# Patient Record
Sex: Female | Born: 1990
Health system: Southern US, Community
[De-identification: ages and names within clinical notes are randomized; demographics above are authoritative.]

## PROBLEM LIST (undated history)

## (undated) DIAGNOSIS — F1010 Alcohol abuse, uncomplicated: Secondary | ICD-10-CM

## (undated) DIAGNOSIS — O039 Complete or unspecified spontaneous abortion without complication: Secondary | ICD-10-CM

## (undated) DIAGNOSIS — F1990 Other psychoactive substance use, unspecified, uncomplicated: Secondary | ICD-10-CM

## (undated) DIAGNOSIS — I10 Essential (primary) hypertension: Secondary | ICD-10-CM

## (undated) DIAGNOSIS — F319 Bipolar disorder, unspecified: Secondary | ICD-10-CM

---
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
## 2009-05-12 ENCOUNTER — Emergency Department: Payer: Self-pay | Admitting: Emergency Medicine

## 2009-06-15 ENCOUNTER — Observation Stay: Payer: Self-pay

## 2009-07-28 ENCOUNTER — Emergency Department (HOSPITAL_COMMUNITY): Admission: EM | Admit: 2009-07-28 | Discharge: 2009-07-28 | Payer: Self-pay | Admitting: Emergency Medicine

## 2009-08-28 ENCOUNTER — Observation Stay: Payer: Self-pay | Admitting: Obstetrics and Gynecology

## 2009-09-19 ENCOUNTER — Observation Stay: Payer: Self-pay

## 2009-09-26 ENCOUNTER — Inpatient Hospital Stay: Payer: Self-pay

## 2009-11-16 ENCOUNTER — Observation Stay: Payer: Self-pay | Admitting: Obstetrics & Gynecology

## 2009-12-10 ENCOUNTER — Observation Stay: Payer: Self-pay | Admitting: Obstetrics and Gynecology

## 2009-12-11 ENCOUNTER — Inpatient Hospital Stay: Payer: Self-pay | Admitting: Obstetrics and Gynecology

## 2010-06-14 ENCOUNTER — Ambulatory Visit: Payer: Self-pay | Admitting: Family Medicine

## 2010-07-11 LAB — BASIC METABOLIC PANEL
BUN: 4 mg/dL — ABNORMAL LOW (ref 6–23)
CO2: 25 mEq/L (ref 19–32)
Calcium: 8.9 mg/dL (ref 8.4–10.5)
Chloride: 105 mEq/L (ref 96–112)
Creatinine, Ser: 0.57 mg/dL (ref 0.4–1.2)
Glucose, Bld: 86 mg/dL (ref 70–99)
Sodium: 135 mEq/L (ref 135–145)

## 2010-07-11 LAB — URINALYSIS, ROUTINE W REFLEX MICROSCOPIC
Bilirubin Urine: NEGATIVE
Ketones, ur: NEGATIVE mg/dL
Nitrite: NEGATIVE
Protein, ur: NEGATIVE mg/dL
Specific Gravity, Urine: 1.005 (ref 1.005–1.030)
pH: 6 (ref 5.0–8.0)

## 2010-07-11 LAB — ABO/RH: ABO/RH(D): A POS

## 2010-07-11 LAB — DIFFERENTIAL
Basophils Relative: 0 % (ref 0–1)
Lymphocytes Relative: 23 % (ref 12–46)
Monocytes Absolute: 0.5 10*3/uL (ref 0.1–1.0)
Neutro Abs: 6.1 10*3/uL (ref 1.7–7.7)

## 2010-07-11 LAB — CBC
Hemoglobin: 12 g/dL (ref 12.0–15.0)
MCHC: 34.7 g/dL (ref 30.0–36.0)
MCV: 79.5 fL (ref 78.0–100.0)
RDW: 14.4 % (ref 11.5–15.5)
WBC: 8.8 10*3/uL (ref 4.0–10.5)

## 2010-07-11 LAB — WET PREP, GENITAL

## 2011-01-08 ENCOUNTER — Ambulatory Visit: Payer: Self-pay

## 2011-11-22 ENCOUNTER — Emergency Department: Payer: Self-pay | Admitting: Emergency Medicine

## 2011-11-22 LAB — COMPREHENSIVE METABOLIC PANEL
Albumin: 4.2 g/dL (ref 3.4–5.0)
Anion Gap: 15 (ref 7–16)
Calcium, Total: 9.2 mg/dL (ref 8.5–10.1)
EGFR (African American): >60
EGFR (Non-African Amer.): >60
Glucose: 90 mg/dL (ref 65–99)
Potassium: 3.8 mmol/L (ref 3.5–5.1)
SGOT(AST): 49 U/L — ABNORMAL HIGH (ref 15–37)

## 2011-11-22 LAB — URINALYSIS, COMPLETE
Hyaline Cast: 3
Nitrite: NEGATIVE
Ph: 5 (ref 4.5–8.0)
Protein: 100
RBC,UR: 2 /HPF (ref 0–5)
Specific Gravity: 1.009 (ref 1.003–1.030)
WBC UR: 10 /HPF (ref 0–5)

## 2011-11-22 LAB — DRUG SCREEN, URINE
Amphetamines, Ur Screen: NEGATIVE (ref ?–1000)
Cocaine Metabolite,Ur ~~LOC~~: NEGATIVE (ref ?–300)
MDMA (Ecstasy)Ur Screen: NEGATIVE (ref ?–500)
Methadone, Ur Screen: NEGATIVE (ref ?–300)
Opiate, Ur Screen: NEGATIVE (ref ?–300)
Tricyclic, Ur Screen: NEGATIVE (ref ?–1000)

## 2011-11-22 LAB — CBC WITH DIFFERENTIAL/PLATELET
Basophil #: 0.1 10*3/uL (ref 0.0–0.1)
HGB: 14.1 g/dL (ref 12.0–16.0)
Lymphocyte #: 1.3 10*3/uL (ref 1.0–3.6)
Lymphocyte %: 6.6 %
Monocyte %: 4.6 %
Platelet: 262 10*3/uL (ref 150–440)
RBC: 5.34 10*6/uL — ABNORMAL HIGH (ref 3.80–5.20)
WBC: 19.9 10*3/uL — ABNORMAL HIGH (ref 3.6–11.0)

## 2011-11-22 LAB — ETHANOL: Ethanol: 171 mg/dL

## 2012-04-13 ENCOUNTER — Encounter (HOSPITAL_COMMUNITY): Payer: Self-pay | Admitting: *Deleted

## 2012-04-13 ENCOUNTER — Emergency Department (HOSPITAL_COMMUNITY)
Admission: EM | Admit: 2012-04-13 | Discharge: 2012-04-13 | Payer: Medicaid Other | Attending: Emergency Medicine | Admitting: Emergency Medicine

## 2012-04-13 ENCOUNTER — Emergency Department (HOSPITAL_COMMUNITY): Payer: Medicaid Other

## 2012-04-13 DIAGNOSIS — F1010 Alcohol abuse, uncomplicated: Secondary | ICD-10-CM | POA: Insufficient documentation

## 2012-04-13 DIAGNOSIS — S0003XA Contusion of scalp, initial encounter: Secondary | ICD-10-CM | POA: Insufficient documentation

## 2012-04-13 DIAGNOSIS — S46909A Unspecified injury of unspecified muscle, fascia and tendon at shoulder and upper arm level, unspecified arm, initial encounter: Secondary | ICD-10-CM | POA: Insufficient documentation

## 2012-04-13 DIAGNOSIS — S60211A Contusion of right wrist, initial encounter: Secondary | ICD-10-CM

## 2012-04-13 DIAGNOSIS — F17200 Nicotine dependence, unspecified, uncomplicated: Secondary | ICD-10-CM | POA: Insufficient documentation

## 2012-04-13 DIAGNOSIS — S0083XA Contusion of other part of head, initial encounter: Secondary | ICD-10-CM

## 2012-04-13 DIAGNOSIS — M542 Cervicalgia: Secondary | ICD-10-CM

## 2012-04-13 DIAGNOSIS — S4980XA Other specified injuries of shoulder and upper arm, unspecified arm, initial encounter: Secondary | ICD-10-CM | POA: Insufficient documentation

## 2012-04-13 DIAGNOSIS — S60219A Contusion of unspecified wrist, initial encounter: Secondary | ICD-10-CM | POA: Insufficient documentation

## 2012-04-13 DIAGNOSIS — IMO0002 Reserved for concepts with insufficient information to code with codable children: Secondary | ICD-10-CM | POA: Insufficient documentation

## 2012-04-13 MED ORDER — OXYCODONE-ACETAMINOPHEN 5-325 MG PO TABS
1.0000 | ORAL_TABLET | Freq: Once | ORAL | Status: AC
  Administered 2012-04-13: 1 via ORAL
  Filled 2012-04-13: qty 1

## 2012-04-13 MED ORDER — HYDROCODONE-ACETAMINOPHEN 5-325 MG PO TABS: 1.0000 | ORAL_TABLET | Freq: Four times a day (QID) | ORAL | Status: DC | PRN

## 2012-04-13 MED ORDER — IBUPROFEN 600 MG PO TABS: 600.0000 mg | ORAL_TABLET | Freq: Three times a day (TID) | ORAL | Status: DC | PRN
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
## 2012-09-05 IMAGING — CR CERVICAL SPINE - COMPLETE 4+ VIEW
1 series · 8 of 8 positions shown · non-contrast
Comparison: none

REASON FOR EXAM: mcv
COMMENTS:   LMP: (Male)

[Series 1: view not recorded · 0.17mm/px · 8 of 8 slices shown]
[im 1/8]
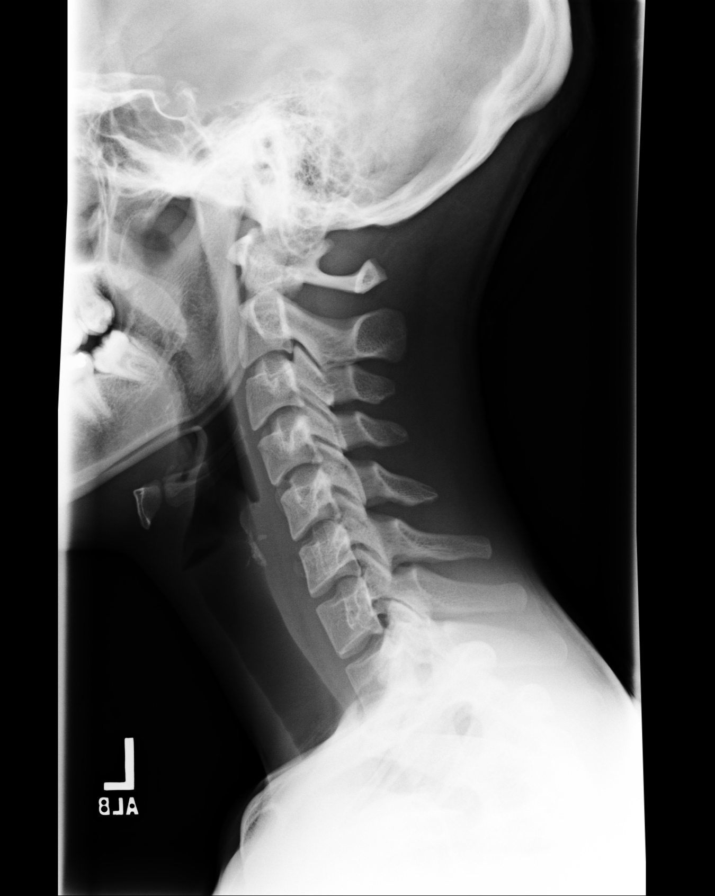
[im 2/8]
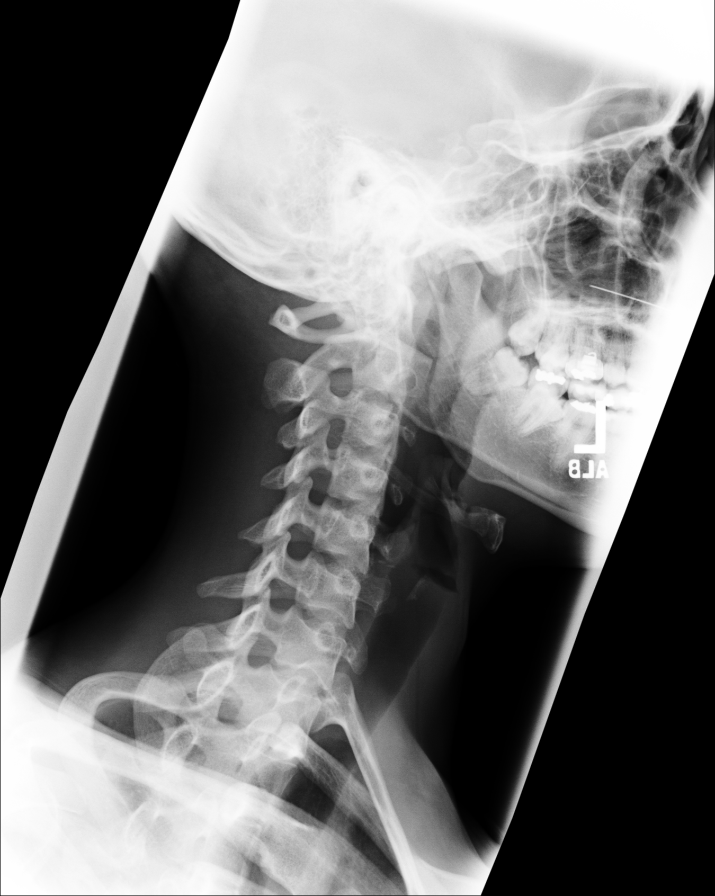
[im 3/8]
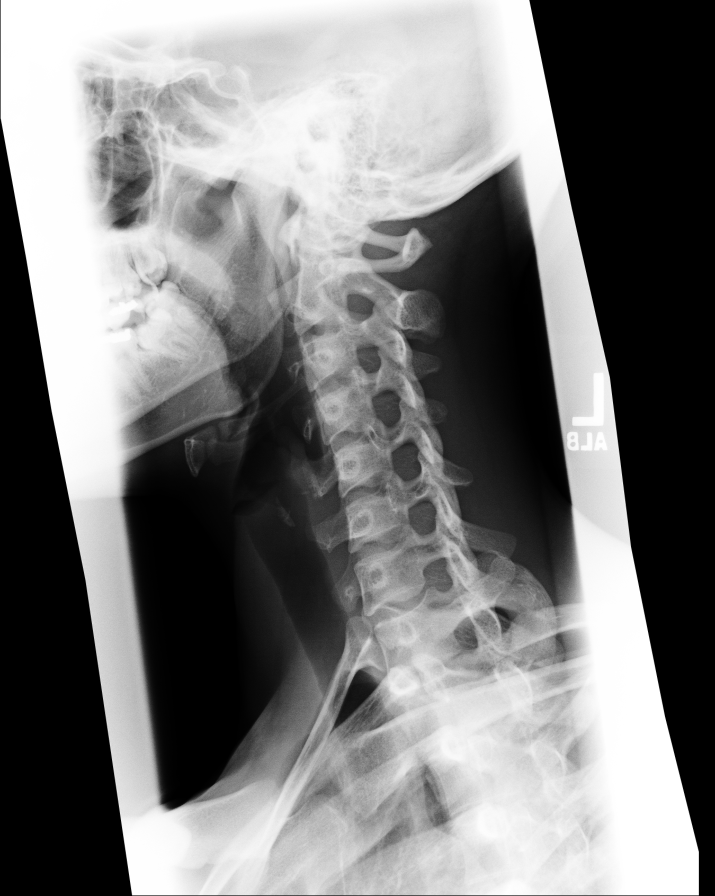
[im 4/8]
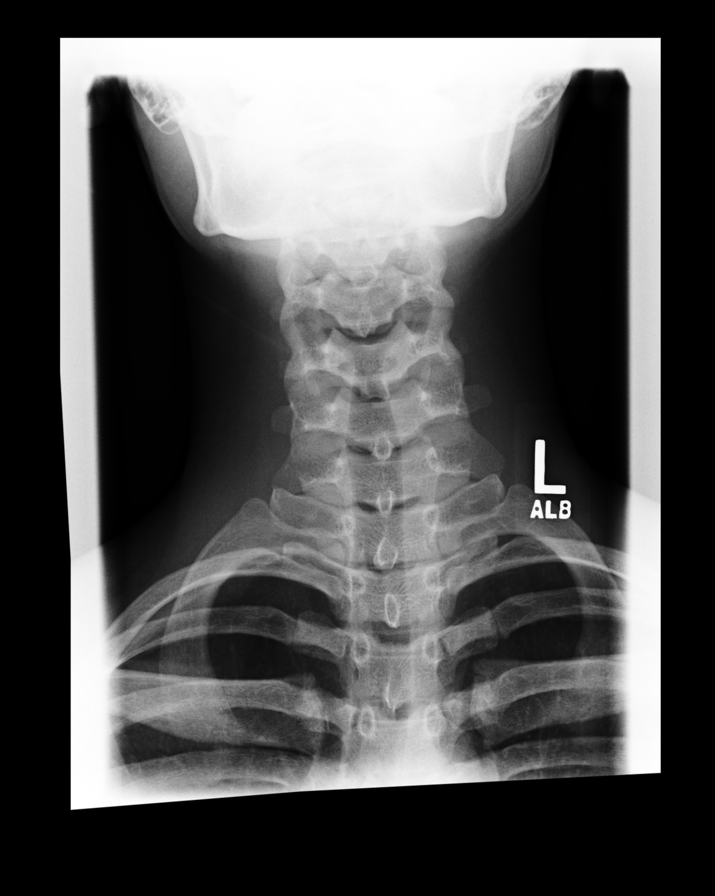
[im 5/8]
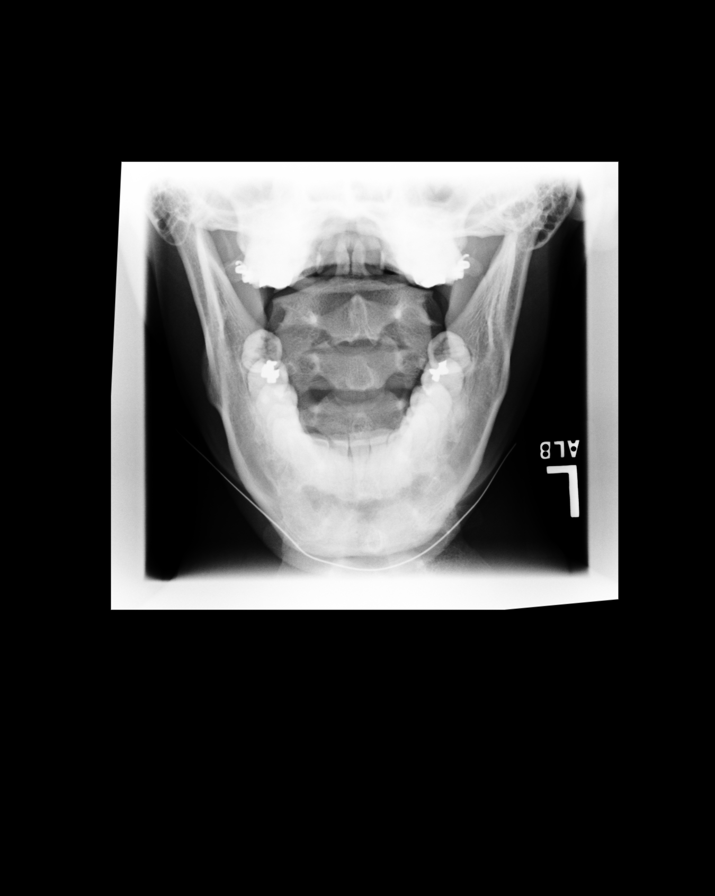
[im 6/8]
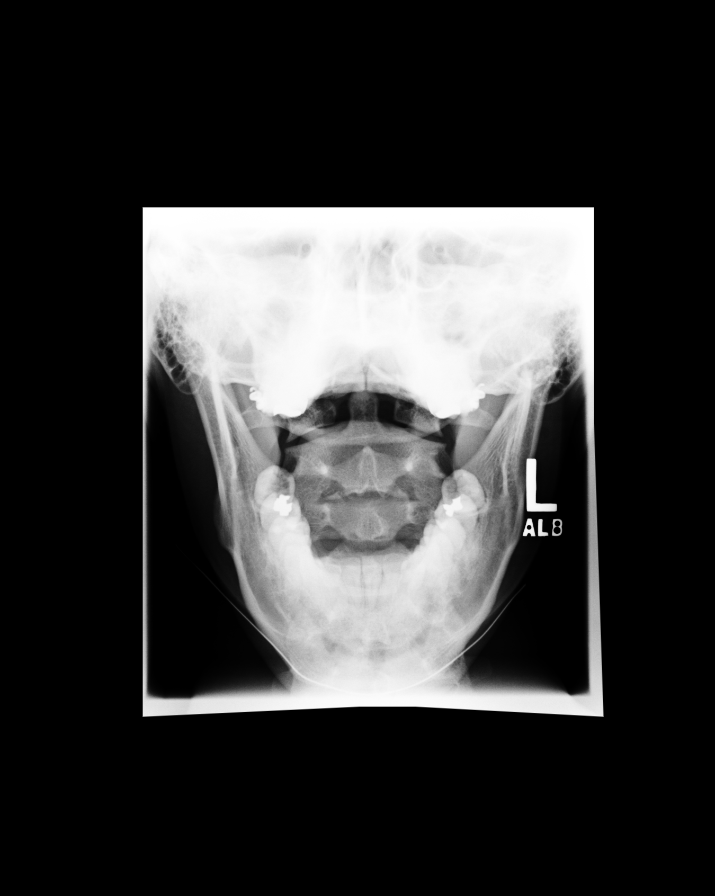
[im 7/8]
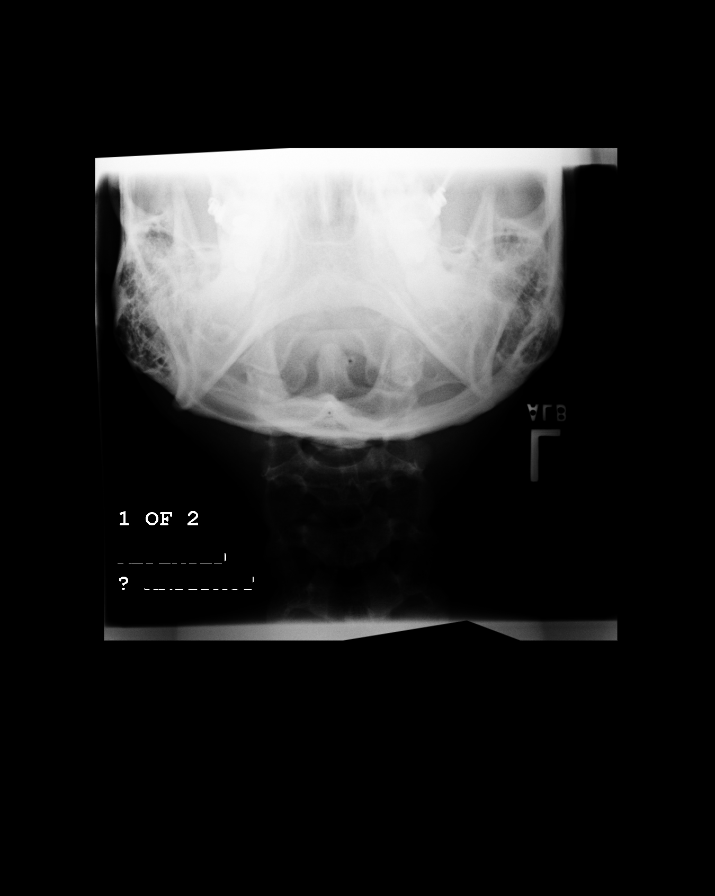
[im 8/8]
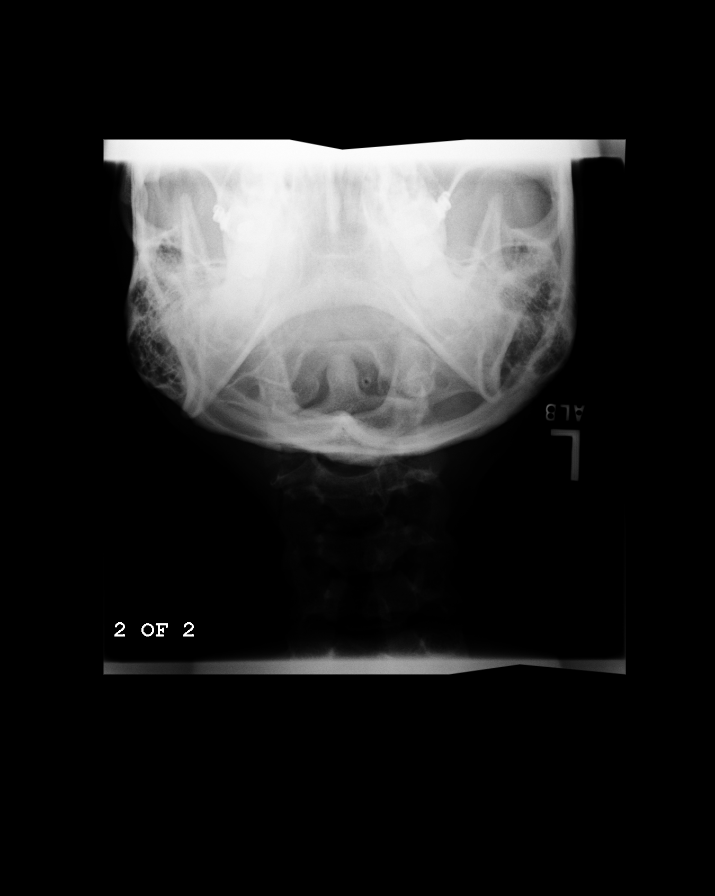

[8 of 8 positions shown; findings below may reference images not displayed]

PROCEDURE:     MDR - MDR CERVICAL SPINE COMPLETE  - January 08, 2011  [DATE]

RESULT:     The cervical vertebral bodies are preserved in height. There is
mild loss of the normal cervical lordosis. The intervertebral disc space
heights are well-maintained. The prevertebral soft tissue spaces appear
normal. The spinous processes are intact. There is no evidence of a perched
facet. The odontoid is intact and the lateral masses of C1 align normally
with those of C2.
IMPRESSION: I see no acute bony abnormality of the cervical spine.

## 2013-03-17 ENCOUNTER — Emergency Department: Payer: Self-pay | Admitting: Emergency Medicine

## 2013-03-17 LAB — DRUG SCREEN, URINE
Amphetamines, Ur Screen: NEGATIVE (ref ?–1000)
Barbiturates, Ur Screen: NEGATIVE (ref ?–200)
Cannabinoid 50 Ng, Ur ~~LOC~~: NEGATIVE (ref ?–50)
Cocaine Metabolite,Ur ~~LOC~~: NEGATIVE (ref ?–300)
MDMA (Ecstasy)Ur Screen: NEGATIVE (ref ?–500)

## 2013-03-17 LAB — CBC
HCT: 42.4 % (ref 35.0–47.0)
HGB: 13.9 g/dL (ref 12.0–16.0)
MCH: 26.3 pg (ref 26.0–34.0)
MCV: 80 fL (ref 80–100)
Platelet: 276 10*3/uL (ref 150–440)
RBC: 5.29 10*6/uL — ABNORMAL HIGH (ref 3.80–5.20)
RDW: 14.7 % — ABNORMAL HIGH (ref 11.5–14.5)
WBC: 6.3 10*3/uL (ref 3.6–11.0)

## 2013-03-17 LAB — URINALYSIS, COMPLETE
Bilirubin,UR: NEGATIVE
Blood: NEGATIVE
Leukocyte Esterase: NEGATIVE
Squamous Epithelial: 1

## 2013-03-17 LAB — PREGNANCY, URINE: Pregnancy Test, Urine: NEGATIVE m[IU]/mL

## 2013-03-17 LAB — COMPREHENSIVE METABOLIC PANEL
Albumin: 4.4 g/dL (ref 3.4–5.0)
Alkaline Phosphatase: 70 U/L
Anion Gap: 7 (ref 7–16)
BUN: 6 mg/dL — ABNORMAL LOW (ref 7–18)
SGOT(AST): 21 U/L (ref 15–37)
SGPT (ALT): 21 U/L (ref 12–78)

## 2013-03-17 LAB — SALICYLATE LEVEL: Salicylates, Serum: 3.3 mg/dL — ABNORMAL HIGH

## 2014-06-05 ENCOUNTER — Emergency Department: Payer: Self-pay | Admitting: Emergency Medicine

## 2014-08-11 ENCOUNTER — Emergency Department
Admit: 2014-08-11 | Disposition: A | Discharge status: Home / Self Care | Payer: Self-pay | Admitting: Emergency Medicine
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
## 2014-08-12 NOTE — Consult Note (Signed)
Brief Consult Note: Diagnosis: alcohol abuse.   Patient was seen by consultant.   Consult note dictated.   Discussed with Attending MD.   Comments: Psychiatry: Patient seen. Chart reviewed. Discussed with ER MD. Patient denies any suicidal ideation and she is calm and not delirius. Has outpt treatment availible already. Not meeting commitmnt criteria. Counceling done. Advise release from ER.  Electronic Signatures: Audery Amellapacs, Josetta Wigal T (MD)  (Signed [phone]-Nov-14 11:14)  Authored: Brief Consult Note   Last Updated: 26-Nov-14 11:14 by Audery Amellapacs, Nivea Wojdyla T (MD)

## 2014-08-14 LAB — BETA STREP CULTURE(ARMC)

## 2015-01-25 ENCOUNTER — Emergency Department
Admission: EM | Admit: 2015-01-25 | Discharge: 2015-01-25 | Discharge status: Against Medical Advice | Payer: Medicaid Other | Attending: Emergency Medicine | Admitting: Emergency Medicine

## 2015-01-25 DIAGNOSIS — K0889 Other specified disorders of teeth and supporting structures: Secondary | ICD-10-CM | POA: Insufficient documentation

## 2015-01-25 DIAGNOSIS — Z720 Tobacco use: Secondary | ICD-10-CM | POA: Insufficient documentation

## 2015-01-25 NOTE — ED Notes (Signed)
Pt to ed with c/o dental pain in left side and states "I need my tooth to be removed today"  I advised we do not offer dental removal, however that our mds would be glad to see her for abscess or infection of tooth.  Pt states "no I had a friend who came up here and got his tooth pulled out of his mouth"  I told her that was not a service of the ER.  Then she stated " I want to be checked for and STD"  Pt denies vaginal d/c, denies odor, denies pain. I asked her about recent exposure to STD and she denies that as well.  She states she wants an HIV test.  I told her we do not offer testing of HIV but suggested the health department as a resource.  She then states I am not waiting then to be seen since you can't pull my teeth and you can't check me for HIV." and pt left.

## 2015-07-06 ENCOUNTER — Emergency Department
Admission: EM | Admit: 2015-07-06 | Discharge: 2015-07-07 | Disposition: A | Discharge status: Home / Self Care | Payer: Self-pay | Attending: Emergency Medicine | Admitting: Emergency Medicine

## 2015-07-06 ENCOUNTER — Emergency Department
Admission: EM | Admit: 2015-07-06 | Discharge: 2015-07-06 | Disposition: A | Discharge status: Home / Self Care | Payer: Medicaid Other | Attending: Emergency Medicine | Admitting: Emergency Medicine

## 2015-07-06 DIAGNOSIS — F1994 Other psychoactive substance use, unspecified with psychoactive substance-induced mood disorder: Secondary | ICD-10-CM

## 2015-07-06 DIAGNOSIS — T300 Burn of unspecified body region, unspecified degree: Secondary | ICD-10-CM

## 2015-07-06 DIAGNOSIS — Z791 Long term (current) use of non-steroidal anti-inflammatories (NSAID): Secondary | ICD-10-CM | POA: Insufficient documentation

## 2015-07-06 DIAGNOSIS — T23291A Burn of second degree of multiple sites of right wrist and hand, initial encounter: Secondary | ICD-10-CM | POA: Insufficient documentation

## 2015-07-06 DIAGNOSIS — Y939 Activity, unspecified: Secondary | ICD-10-CM | POA: Insufficient documentation

## 2015-07-06 DIAGNOSIS — Y929 Unspecified place or not applicable: Secondary | ICD-10-CM | POA: Insufficient documentation

## 2015-07-06 DIAGNOSIS — Z79899 Other long term (current) drug therapy: Secondary | ICD-10-CM | POA: Insufficient documentation

## 2015-07-06 DIAGNOSIS — T23201A Burn of second degree of right hand, unspecified site, initial encounter: Secondary | ICD-10-CM | POA: Insufficient documentation

## 2015-07-06 DIAGNOSIS — F10921 Alcohol use, unspecified with intoxication delirium: Secondary | ICD-10-CM

## 2015-07-06 DIAGNOSIS — X58XXXA Exposure to other specified factors, initial encounter: Secondary | ICD-10-CM | POA: Insufficient documentation

## 2015-07-06 DIAGNOSIS — F1410 Cocaine abuse, uncomplicated: Secondary | ICD-10-CM

## 2015-07-06 DIAGNOSIS — F17200 Nicotine dependence, unspecified, uncomplicated: Secondary | ICD-10-CM | POA: Insufficient documentation

## 2015-07-06 DIAGNOSIS — Y999 Unspecified external cause status: Secondary | ICD-10-CM | POA: Insufficient documentation

## 2015-07-06 DIAGNOSIS — F1010 Alcohol abuse, uncomplicated: Secondary | ICD-10-CM

## 2015-07-06 DIAGNOSIS — F10121 Alcohol abuse with intoxication delirium: Secondary | ICD-10-CM | POA: Insufficient documentation

## 2015-07-06 DIAGNOSIS — T23292A Burn of second degree of multiple sites of left wrist and hand, initial encounter: Secondary | ICD-10-CM | POA: Insufficient documentation

## 2015-07-06 DIAGNOSIS — T23202A Burn of second degree of left hand, unspecified site, initial encounter: Secondary | ICD-10-CM | POA: Insufficient documentation

## 2015-07-06 DIAGNOSIS — X088XXA Exposure to other specified smoke, fire and flames, initial encounter: Secondary | ICD-10-CM | POA: Insufficient documentation

## 2015-07-06 LAB — CBC WITH DIFFERENTIAL/PLATELET
Basophils Absolute: 0.1 10*3/uL (ref 0–0.1)
EOS ABS: 0.2 10*3/uL (ref 0–0.7)
Eosinophils Relative: 2 %
HCT: 43.6 % (ref 35.0–47.0)
HEMOGLOBIN: 14.4 g/dL (ref 12.0–16.0)
Lymphocytes Relative: 26 %
Lymphs Abs: 2 10*3/uL (ref 1.0–3.6)
MCH: 28.2 pg (ref 26.0–34.0)
MCHC: 33.1 g/dL (ref 32.0–36.0)
MCV: 85 fL (ref 80.0–100.0)
Monocytes Absolute: 0.4 10*3/uL (ref 0.2–0.9)
Neutro Abs: 5 10*3/uL (ref 1.4–6.5)
Platelets: 229 10*3/uL (ref 150–440)
RBC: 5.13 MIL/uL (ref 3.80–5.20)
RDW: 13.8 % (ref 11.5–14.5)
WBC: 7.7 10*3/uL (ref 3.6–11.0)

## 2015-07-06 LAB — COMPREHENSIVE METABOLIC PANEL
ALK PHOS: 61 U/L (ref 38–126)
ALT: 16 U/L (ref 14–54)
AST: 27 U/L (ref 15–41)
Albumin: 4.8 g/dL (ref 3.5–5.0)
Anion gap: 10 (ref 5–15)
BILIRUBIN TOTAL: 0.6 mg/dL (ref 0.3–1.2)
BUN: 10 mg/dL (ref 6–20)
CALCIUM: 9.4 mg/dL (ref 8.9–10.3)
CO2: 22 mmol/L (ref 22–32)
CREATININE: 1.04 mg/dL — AB (ref 0.44–1.00)
Chloride: 109 mmol/L (ref 101–111)
Glucose, Bld: 84 mg/dL (ref 65–99)
Potassium: 3.3 mmol/L — ABNORMAL LOW (ref 3.5–5.1)
Sodium: 141 mmol/L (ref 135–145)
Total Protein: 8.5 g/dL — ABNORMAL HIGH (ref 6.5–8.1)

## 2015-07-06 LAB — SALICYLATE LEVEL

## 2015-07-06 LAB — HCG, QUANTITATIVE, PREGNANCY: hCG, Beta Chain, Quant, S: <1 m[IU]/mL (ref <5)

## 2015-07-06 LAB — ETHANOL: ALCOHOL ETHYL (B): 332 mg/dL — AB (ref <5)

## 2015-07-06 LAB — ACETAMINOPHEN LEVEL: Acetaminophen (Tylenol), Serum: <10 ug/mL — ABNORMAL LOW (ref 10–30)

## 2015-07-06 MED ORDER — ZIPRASIDONE MESYLATE 20 MG IM SOLR
INTRAMUSCULAR | Status: AC
  Administered 2015-07-06: 20 mg via INTRAMUSCULAR
  Filled 2015-07-06: qty 20

## 2015-07-06 MED ORDER — DIPHENHYDRAMINE HCL 50 MG/ML IJ SOLN
50.0000 mg | Freq: Once | INTRAMUSCULAR | Status: AC
  Administered 2015-07-06: 50 mg via INTRAMUSCULAR

## 2015-07-06 MED ORDER — DIPHENHYDRAMINE HCL 50 MG/ML IJ SOLN
INTRAMUSCULAR | Status: AC
  Administered 2015-07-06: 50 mg via INTRAMUSCULAR
  Filled 2015-07-06: qty 1

## 2015-07-06 MED ORDER — LORAZEPAM 2 MG/ML IJ SOLN
INTRAMUSCULAR | Status: AC
  Filled 2015-07-06: qty 1

## 2015-07-06 MED ORDER — ZIPRASIDONE MESYLATE 20 MG IM SOLR
20.0000 mg | Freq: Once | INTRAMUSCULAR | Status: AC
  Administered 2015-07-06: 20 mg via INTRAMUSCULAR

## 2015-07-06 NOTE — ED Notes (Signed)
IVC/ Consult pending 
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
## 2015-07-06 NOTE — ED Notes (Signed)
md notified of critical ETOH 332

## 2015-07-06 NOTE — ED Notes (Signed)

## 2015-07-06 NOTE — [facility] Counselor (Signed)
TTS counselor made attempt to assess patient; however, evaluation could not be completed due to the IM pt received earlier.  Pt to be assessed when alert.

## 2015-07-07 DIAGNOSIS — F1994 Other psychoactive substance use, unspecified with psychoactive substance-induced mood disorder: Secondary | ICD-10-CM

## 2015-07-07 DIAGNOSIS — F1410 Cocaine abuse, uncomplicated: Secondary | ICD-10-CM

## 2015-07-07 DIAGNOSIS — F1010 Alcohol abuse, uncomplicated: Secondary | ICD-10-CM

## 2015-07-07 LAB — URINALYSIS COMPLETE WITH MICROSCOPIC (ARMC ONLY)
BILIRUBIN URINE: NEGATIVE
Glucose, UA: NEGATIVE mg/dL
KETONES UR: NEGATIVE mg/dL
NITRITE: POSITIVE — AB
PH: 5 (ref 5.0–8.0)
Protein, ur: NEGATIVE mg/dL
SPECIFIC GRAVITY, URINE: 1.008 (ref 1.005–1.030)

## 2015-07-07 LAB — URINE DRUG SCREEN, QUALITATIVE (ARMC ONLY)
AMPHETAMINES, UR SCREEN: NOT DETECTED
BENZODIAZEPINE, UR SCRN: NOT DETECTED
Barbiturates, Ur Screen: NOT DETECTED
CANNABINOID 50 NG, UR ~~LOC~~: POSITIVE — AB
Cocaine Metabolite,Ur ~~LOC~~: POSITIVE — AB
MDMA (ECSTASY) UR SCREEN: NOT DETECTED
Methadone Scn, Ur: NOT DETECTED
OPIATE, UR SCREEN: NOT DETECTED
PHENCYCLIDINE (PCP) UR S: NOT DETECTED
Tricyclic, Ur Screen: NOT DETECTED

## 2015-07-07 MED ORDER — IBUPROFEN 800 MG PO TABS
800.0000 mg | ORAL_TABLET | Freq: Once | ORAL | Status: AC
  Administered 2015-07-07: 800 mg via ORAL
  Filled 2015-07-07: qty 1

## 2015-07-07 MED ORDER — SILVER SULFADIAZINE 1 % EX CREA: 1.0000 "application " | TOPICAL_CREAM | Freq: Every day | CUTANEOUS | Status: DC

## 2015-07-07 NOTE — ED Notes (Signed)
Pt got in contact with grandmother who is on the way to pick patient up.  Patient being discharged now and sent to lobby to wait for ride.

## 2015-07-07 NOTE — ED Notes (Signed)
Spoke with Gavin Poundeborah from IT who stated Waynette ButteryErica Vieau chart needed to be discharged from ED in order to [facility] correct [facility].
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
## 2015-07-07 NOTE — ED Notes (Signed)
Discharging patient from system per Gavin Poundeborah with IT to be able to merge contents to correct chart.

## 2015-07-07 NOTE — ED Notes (Signed)
Patient checked in to the ED under the incorrect name; provided sister's name. Of note, patient presented to the ED with IVC papers that also had the incorrect name. This has been a known issue since around 2000, however we have been unable to contact anyone to identify the patient who is in the ED; unknown identity and DOB. Patient identified by ED staff member who happens to be related to this patient at [id number]. Correct name and DOB entered; patient registered under new account. IT called by this RN and asked to merge records from the name that patient checked in under Kayla Conner(Kayla Conner; MRN [id number]) with the new account Kayla Conner(Kayla Conner; MRN [id number]). Magistrate being contacted by ED secretaries in efforts to obtain IVC papers that reflect the patient's true identity. ED charge nurse aware of situation.

## 2015-07-07 NOTE — ED Notes (Signed)
This [facility].  Mont states at work right now and unable to pick patient up until around 1800. Patient given phone to call to find another ride at this time.

## 2015-07-07 NOTE — ED Notes (Signed)
Patient continues to rest with NAD noted. Patient has had no further behaviors since earlier. There are no anticipated needs. Will continue to monitor.

## 2015-07-07 NOTE — ED Notes (Signed)
Pt IVC pending psych consult/Officer Giroux returned with IVC papers with the correct name of the patient.

## 2015-07-07 NOTE — ED Notes (Addendum)
Patient observed sleeping at this NAD: respirations even and non-labored; NAD noted. Issues with patient account known by previous RN; communicated to this RN; we are working with registration and IT to resolve. Patient with no anticipated needs. Reassessment limited due to previous medications (Geodon and Benadryl) was was administered earlier today. Patient also intoxicated, and with the combination of these two factors, she is somnolent but arousable. Will continue to monitor.

## 2015-07-07 NOTE — ED Notes (Signed)
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
## 2015-07-07 NOTE — ED Notes (Signed)
Patient resting in room with NAD noted at this time. Patient has been calm since family member spoke with her earlier. Patient now sleeping; respirations even and non-labored. No anticipated needs at this time. Will continue to monitor.

## 2015-07-07 NOTE — ED Notes (Addendum)
Patient acting out. Patient stripping naked and refusing to wear hospital provided scrubs citing that she wanted her own clothes. Patient attempting to leave - redirected by this RN and [employer] officer. Patient upset. Previously mentioned staff member (related to patient) present in the ED - she was asked to come to bedside to speak with patient; obliged request; situation calmed.

## 2015-07-07 NOTE — ED Notes (Signed)
Patient much more calm after speaking with family member. She has been informed that she is under IVC and that she could not leave. Patient reminded that urine sample still required; sample provided at this time. Patient provided with PO fluids. No further verbalized needs. Will continue to monitor.

## 2015-07-07 NOTE — ED Notes (Signed)
Nurse called lab to get result of urine sent.  Lab stated "had not been received" and processing it now.
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
## 2015-07-07 NOTE — Discharge Instructions (Signed)
Burn Care Your skin is a natural barrier to infection. It is the largest organ of your body. Burns damage this natural protection. To help prevent infection, it is very important to follow your caregiver's instructions in the care of your burn. Burns are classified as:  First degree. There is only redness of the skin (erythema). No scarring is expected.  Second degree. There is blistering of the skin. Scarring may occur with deeper burns.  Third degree. All layers of the skin are injured, and scarring is expected. HOME CARE INSTRUCTIONS   Wash your hands well before changing your bandage.  Change your bandage as often as directed by your caregiver.  Remove the old bandage. If the bandage sticks, you may soak it off with cool, clean water.  Cleanse the burn thoroughly but gently with mild soap and water.  Pat the area dry with a clean, dry cloth.  Apply a thin layer of antibacterial cream to the burn.  Apply a clean bandage as instructed by your caregiver.  Keep the bandage as clean and dry as possible.  Elevate the affected area for the first 24 hours, then as instructed by your caregiver.  Only take over-the-counter or prescription medicines for pain, discomfort, or fever as directed by your caregiver. SEEK IMMEDIATE MEDICAL CARE IF:   You develop excessive pain.  You develop redness, tenderness, swelling, or red streaks near the burn.  The burned area develops yellowish-white fluid (pus) or a bad smell.  You have a fever. MAKE SURE YOU:   Understand these instructions.  Will watch your condition.  Will get help right away if you are not doing well or get worse.   This information is not intended to replace advice given to you by your health care provider. Make sure you discuss any questions you have with your health care provider.   Document Released: 04/08/2005 Document Revised: 07/01/2011 Document Reviewed: 08/29/2010 Elsevier Interactive Patient Education 2016  [employer].  Polysubstance Abuse When people abuse more than one drug or type of drug it is called polysubstance or polydrug abuse. For example, many smokers also drink alcohol. This is one form of polydrug abuse. Polydrug abuse also refers to the use of a drug to counteract an unpleasant effect produced by another drug. It may also be used to help with withdrawal from another drug. People who take stimulants may become agitated. Sometimes this agitation is countered with a tranquilizer. This helps protect against the unpleasant side effects. Polydrug abuse also refers to the use of different drugs at the same time.  Anytime drug use is interfering with normal living activities, it has become abuse. This includes problems with family and friends. Psychological dependence has developed when your mind tells you that the drug is needed. This is usually followed by physical dependence which has developed when continuing increases of drug are required to get the same feeling or "high". This is known as addiction or chemical dependency. A person's risk is much higher if there is a history of chemical dependency in the family. SIGNS OF CHEMICAL DEPENDENCY  You have been told by friends or family that drugs have become a problem.  You fight when using drugs.  You are having blackouts (not remembering what you do while using).  You feel sick from using drugs but continue using.  You lie about use or amounts of drugs (chemicals) used.  You need chemicals to get you going.  You are suffering in work performance or in school because  of drug use.  You get sick from use of drugs but continue to use anyway.  You need drugs to relate to people or feel comfortable in social situations.  You use drugs to forget problems. "Yes" answered to any of the above signs of chemical dependency indicates there are problems. The longer the use of drugs continues, the greater the problems will become. If there is a  family history of drug or alcohol use, it is best not to experiment with these drugs. Continual use leads to tolerance. After tolerance develops more of the drug is needed to get the same feeling. This is followed by addiction. With addiction, drugs become the most important part of life. It becomes more important to take drugs than participate in the other usual activities of life. This includes relating to friends and family. Addiction is followed by dependency. Dependency is a condition where drugs are now needed not just to get high, but to feel normal. Addiction cannot be cured but it can be stopped. This often requires outside help and the care of professionals. Treatment centers are listed in the yellow pages under: Cocaine, Narcotics, and Alcoholics Anonymous. Most hospitals and clinics can refer you to a specialized care center. Talk to your caregiver if you need help.   This information is not intended to replace advice given to you by your health care provider. Make sure you discuss any questions you have with your health care provider.   Document Released: 11/28/2004 Document Revised: 07/01/2011 Document Reviewed: 04/13/2014 Elsevier Interactive Patient Education [employer]2016 Elsevier Inc.
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
## 2015-07-07 NOTE — ED Notes (Signed)
Pt requesting to speak to nurse.  States "When can I go home? I was just drunk last night, there aint nothing wrong with me".  Pt informed under IVC and will have to see psychiatry today.  Offered to turn tv on for pt or other comfort measures, pt refused and denies at this time.

## 2015-07-07 NOTE — ED Notes (Signed)
Pt IVC/when papers were initiated, pt gave the wrong name. [employer] Officer Giroux was the petitioner/I called the magistrate after registration put the right person in the system and the magistrate stated that officer Giroux is going to come and bring the papers on Entergy CorporationLakeisha Conner. The IVC papers that were originally under DrainErica Doetsch(her sister) is to be turned in by Ciscofficer Giroux and to be destroyed by [place] magistrate.

## 2016-03-13 ENCOUNTER — Encounter: Payer: Self-pay | Admitting: Emergency Medicine

## 2016-03-13 ENCOUNTER — Emergency Department: Payer: Medicaid Other

## 2016-03-13 ENCOUNTER — Emergency Department
Admission: EM | Admit: 2016-03-13 | Discharge: 2016-03-13 | Disposition: A | Discharge status: Home / Self Care | Payer: Medicaid Other | Attending: Emergency Medicine | Admitting: Emergency Medicine

## 2016-03-13 DIAGNOSIS — Y92410 Unspecified street and highway as the place of occurrence of the external cause: Secondary | ICD-10-CM | POA: Diagnosis not present

## 2016-03-13 DIAGNOSIS — Y939 Activity, unspecified: Secondary | ICD-10-CM | POA: Insufficient documentation

## 2016-03-13 DIAGNOSIS — Y999 Unspecified external cause status: Secondary | ICD-10-CM | POA: Diagnosis not present

## 2016-03-13 DIAGNOSIS — M62838 Other muscle spasm: Secondary | ICD-10-CM

## 2016-03-13 DIAGNOSIS — Z79899 Other long term (current) drug therapy: Secondary | ICD-10-CM | POA: Insufficient documentation

## 2016-03-13 DIAGNOSIS — F17200 Nicotine dependence, unspecified, uncomplicated: Secondary | ICD-10-CM | POA: Diagnosis not present

## 2016-03-13 DIAGNOSIS — S46912A Strain of unspecified muscle, fascia and tendon at shoulder and upper arm level, left arm, initial encounter: Secondary | ICD-10-CM | POA: Diagnosis not present

## 2016-03-13 DIAGNOSIS — Z791 Long term (current) use of non-steroidal anti-inflammatories (NSAID): Secondary | ICD-10-CM | POA: Insufficient documentation

## 2016-03-13 DIAGNOSIS — S4992XA Unspecified injury of left shoulder and upper arm, initial encounter: Secondary | ICD-10-CM | POA: Diagnosis present

## 2016-03-13 HISTORY — DX: Complete or unspecified spontaneous abortion without complication: O03.9

## 2016-03-13 LAB — POCT PREGNANCY, URINE: PREG TEST UR: NEGATIVE

## 2016-03-13 MED ORDER — NAPROXEN 500 MG PO TABS: 500.0000 mg | ORAL_TABLET | Freq: Two times a day (BID) | ORAL | 0 refills | Status: DC

## 2016-03-13 MED ORDER — IBUPROFEN 600 MG PO TABS
600.0000 mg | ORAL_TABLET | Freq: Once | ORAL | Status: AC
Start: 2016-03-13 — End: 2016-03-13
  Administered 2016-03-13: 600 mg via ORAL
  Filled 2016-03-13: qty 1

## 2016-03-13 MED ORDER — CYCLOBENZAPRINE HCL 5 MG PO TABS: 5.0000 mg | ORAL_TABLET | Freq: Three times a day (TID) | ORAL | 0 refills | Status: DC | PRN
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
## 2016-03-13 NOTE — ED Notes (Signed)
Pt verbalized understanding of discharge instructions. NAD at this time. 
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
## 2016-11-05 ENCOUNTER — Encounter: Payer: Self-pay | Admitting: Emergency Medicine

## 2016-11-05 ENCOUNTER — Ambulatory Visit
Admission: EM | Admit: 2016-11-05 | Discharge: 2016-11-05 | Disposition: A | Discharge status: Home / Self Care | Payer: Medicaid Other | Attending: Family Medicine | Admitting: Family Medicine

## 2016-11-05 ENCOUNTER — Ambulatory Visit: Payer: Medicaid Other

## 2016-11-05 DIAGNOSIS — M25571 Pain in right ankle and joints of right foot: Secondary | ICD-10-CM | POA: Diagnosis present

## 2016-11-05 DIAGNOSIS — F17210 Nicotine dependence, cigarettes, uncomplicated: Secondary | ICD-10-CM | POA: Diagnosis not present

## 2016-11-05 DIAGNOSIS — F319 Bipolar disorder, unspecified: Secondary | ICD-10-CM | POA: Insufficient documentation

## 2016-11-05 DIAGNOSIS — T25011A Burn of unspecified degree of right ankle, initial encounter: Secondary | ICD-10-CM | POA: Diagnosis not present

## 2016-11-05 DIAGNOSIS — M79671 Pain in right foot: Secondary | ICD-10-CM | POA: Diagnosis not present

## 2016-11-05 DIAGNOSIS — T24001A Burn of unspecified degree of unspecified site of right lower limb, except ankle and foot, initial encounter: Secondary | ICD-10-CM

## 2016-11-05 DIAGNOSIS — I10 Essential (primary) hypertension: Secondary | ICD-10-CM | POA: Insufficient documentation

## 2016-11-05 DIAGNOSIS — X17XXXA Contact with hot engines, machinery and tools, initial encounter: Secondary | ICD-10-CM | POA: Diagnosis not present

## 2016-11-05 HISTORY — DX: Bipolar disorder, unspecified: F31.9

## 2016-11-05 HISTORY — DX: Essential (primary) hypertension: I10

## 2016-11-05 MED ORDER — SILVER SULFADIAZINE 1 % EX CREA
TOPICAL_CREAM | Freq: Once | CUTANEOUS | Status: AC
  Administered 2016-11-05: 1 via TOPICAL

## 2016-11-05 MED ORDER — MELOXICAM 7.5 MG PO TABS: 7.5000 mg | ORAL_TABLET | Freq: Every day | ORAL | 0 refills | Status: DC

## 2016-11-05 MED ORDER — SILVER SULFADIAZINE 1 % EX CREA: 1.0000 "application " | TOPICAL_CREAM | Freq: Two times a day (BID) | CUTANEOUS | 0 refills | Status: AC

## 2016-11-05 MED ORDER — AMOXICILLIN-POT CLAVULANATE 875-125 MG PO TABS: 1.0000 | ORAL_TABLET | Freq: Two times a day (BID) | ORAL | 0 refills | Status: DC
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
## 2016-11-05 NOTE — Discharge Instructions (Signed)
Take medication as prescribed. Rest. Drink plenty of fluids. Elevate area.  Follow up with your primary care physician or Burn clinic in 2 days for recheck. Call today to schedule, this is important. Return to Urgent care for new or worsening concerns.    [facility] [id number] [place], [place] [id number] Burn Clinic: Appointments [phone]

## 2016-11-14 ENCOUNTER — Encounter: Payer: Self-pay | Admitting: Emergency Medicine

## 2016-11-14 ENCOUNTER — Emergency Department
Admission: EM | Admit: 2016-11-14 | Discharge: 2016-11-15 | Disposition: A | Discharge status: Home / Self Care | Payer: Medicaid Other | Attending: Emergency Medicine | Admitting: Emergency Medicine

## 2016-11-14 DIAGNOSIS — F10121 Alcohol abuse with intoxication delirium: Secondary | ICD-10-CM | POA: Diagnosis not present

## 2016-11-14 DIAGNOSIS — F918 Other conduct disorders: Secondary | ICD-10-CM | POA: Diagnosis present

## 2016-11-14 DIAGNOSIS — Z79899 Other long term (current) drug therapy: Secondary | ICD-10-CM | POA: Insufficient documentation

## 2016-11-14 LAB — URINE DRUG SCREEN, QUALITATIVE (ARMC ONLY)
AMPHETAMINES, UR SCREEN: NOT DETECTED
BENZODIAZEPINE, UR SCRN: NOT DETECTED
Barbiturates, Ur Screen: NOT DETECTED
COCAINE METABOLITE, UR ~~LOC~~: NOT DETECTED
Cannabinoid 50 Ng, Ur ~~LOC~~: NOT DETECTED
MDMA (ECSTASY) UR SCREEN: NOT DETECTED
METHADONE SCREEN, URINE: NOT DETECTED
OPIATE, UR SCREEN: NOT DETECTED
Phencyclidine (PCP) Ur S: NOT DETECTED
Tricyclic, Ur Screen: NOT DETECTED

## 2016-11-14 LAB — COMPREHENSIVE METABOLIC PANEL
ALT: 26 U/L (ref 14–54)
AST: 43 U/L — AB (ref 15–41)
Albumin: 4.2 g/dL (ref 3.5–5.0)
Alkaline Phosphatase: 59 U/L (ref 38–126)
Anion gap: 8 (ref 5–15)
BILIRUBIN TOTAL: 0.4 mg/dL (ref 0.3–1.2)
BUN: 6 mg/dL (ref 6–20)
CO2: 24 mmol/L (ref 22–32)
CREATININE: 0.79 mg/dL (ref 0.44–1.00)
Calcium: 9.3 mg/dL (ref 8.9–10.3)
Chloride: 115 mmol/L — ABNORMAL HIGH (ref 101–111)
Glucose, Bld: 95 mg/dL (ref 65–99)
POTASSIUM: 3.8 mmol/L (ref 3.5–5.1)
Sodium: 147 mmol/L — ABNORMAL HIGH (ref 135–145)
TOTAL PROTEIN: 7.2 g/dL (ref 6.5–8.1)

## 2016-11-14 LAB — SALICYLATE LEVEL

## 2016-11-14 LAB — URINALYSIS, COMPLETE (UACMP) WITH MICROSCOPIC
Bacteria, UA: NONE SEEN
Bilirubin Urine: NEGATIVE
GLUCOSE, UA: NEGATIVE mg/dL
HGB URINE DIPSTICK: NEGATIVE
Ketones, ur: NEGATIVE mg/dL
Leukocytes, UA: NEGATIVE
NITRITE: NEGATIVE
PH: 6 (ref 5.0–8.0)
Protein, ur: NEGATIVE mg/dL
RBC / HPF: NONE SEEN RBC/hpf (ref 0–5)
SPECIFIC GRAVITY, URINE: 1.002 — AB (ref 1.005–1.030)
WBC, UA: NONE SEEN WBC/hpf (ref 0–5)

## 2016-11-14 LAB — CBC WITH DIFFERENTIAL/PLATELET
BASOS ABS: 0 10*3/uL (ref 0–0.1)
BASOS PCT: 1 %
EOS ABS: 0.1 10*3/uL (ref 0–0.7)
Eosinophils Relative: 2 %
HCT: 37.8 % (ref 35.0–47.0)
Hemoglobin: 12.6 g/dL (ref 12.0–16.0)
Lymphocytes Relative: 24 %
Lymphs Abs: 1.5 10*3/uL (ref 1.0–3.6)
MCH: 27.9 pg (ref 26.0–34.0)
MCHC: 33.4 g/dL (ref 32.0–36.0)
MCV: 83.5 fL (ref 80.0–100.0)
MONO ABS: 0.4 10*3/uL (ref 0.2–0.9)
MONOS PCT: 7 %
Neutro Abs: 4.1 10*3/uL (ref 1.4–6.5)
Neutrophils Relative %: 66 %
PLATELETS: 242 10*3/uL (ref 150–440)
RBC: 4.53 MIL/uL (ref 3.80–5.20)
RDW: 14.4 % (ref 11.5–14.5)
WBC: 6.1 10*3/uL (ref 3.6–11.0)

## 2016-11-14 LAB — ETHANOL: Alcohol, Ethyl (B): 351 mg/dL (ref <5)

## 2016-11-14 LAB — LIPASE, BLOOD: LIPASE: 42 U/L (ref 11–51)

## 2016-11-14 LAB — PREGNANCY, URINE: PREG TEST UR: NEGATIVE

## 2016-11-14 LAB — ACETAMINOPHEN LEVEL

## 2016-11-14 MED ORDER — LORAZEPAM 2 MG/ML IJ SOLN
1.0000 mg | Freq: Once | INTRAMUSCULAR | Status: AC
  Administered 2016-11-14: 1 mg via INTRAMUSCULAR

## 2016-11-14 MED ORDER — LORAZEPAM 2 MG/ML IJ SOLN
INTRAMUSCULAR | Status: AC
  Administered 2016-11-14: 1 mg via INTRAMUSCULAR
  Filled 2016-11-14: qty 1

## 2016-11-14 NOTE — ED Notes (Signed)
Pt remains awake, has been given permission by Dr Darnelle CatalanMalinda to use the phone and call someone to visit her and take her home if the opportunity arises. Pt has called approx 5 people with no one yet agreeing to come. Pt has become very distraught, but is still cooperating.

## 2016-11-14 NOTE — Discharge Instructions (Signed)
Please do not drink anywhere near as much as you did tonight. Your alcohol level was almost in the toxic range. You can consider following up with Alcoholics Anonymous. Please follow-up with your doctor. Return for any further problems.

## 2016-11-14 NOTE — ED Notes (Signed)
Pt continues to rest comfortably at this time, without incident.   
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
## 2016-11-14 NOTE — ED Notes (Signed)
Pt has fallen asleep, and is sleeping comfortably.

## 2016-11-14 NOTE — ED Notes (Signed)
SOC camera placed in room at this time. Mardelle MatteAndy, EDT, remains in room as 1:1 Recruitment consultantsafety sitter.

## 2016-11-14 NOTE — ED Notes (Signed)
SOC session completed.

## 2016-11-14 NOTE — BH Assessment (Signed)
Pt was evaluated by Specialist on call and does not meet criteria for inpatient hospitalization.  Patient to be d/c home with responsible adult.
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
## 2016-11-14 NOTE — ED Notes (Signed)
Pt finished eating a [place] sandwich, bag of chips, and 2nd ginger ale.
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
## 2016-11-14 NOTE — ED Notes (Signed)
Pt is sitting up in bed and eating crackers, drinking ginger ale.

## 2016-11-14 NOTE — ED Notes (Signed)
Mardelle MatteAndy, EDT, still in room as 1:1 Recruitment consultantsafety sitter.

## 2016-11-14 NOTE — ED Notes (Signed)
Pt continues to rest comfortably at this time, without incident.

## 2016-11-14 NOTE — ED Notes (Signed)
Pt resting comfortably at this time, without incident.

## 2016-11-15 ENCOUNTER — Encounter: Payer: Self-pay | Admitting: Emergency Medicine

## 2016-11-15 NOTE — ED Notes (Addendum)
Pt waiting in lobby for [facility] eagle, first nurse aware, pt A&O, NAD noted
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
## 2018-10-01 ENCOUNTER — Other Ambulatory Visit: Payer: Self-pay

## 2018-10-01 ENCOUNTER — Encounter: Payer: Self-pay | Admitting: *Deleted

## 2018-10-01 ENCOUNTER — Emergency Department
Admission: EM | Admit: 2018-10-01 | Discharge: 2018-10-01 | Disposition: A | Discharge status: Home / Self Care | Payer: Self-pay | Attending: Emergency Medicine | Admitting: Emergency Medicine

## 2018-10-01 DIAGNOSIS — Y939 Activity, unspecified: Secondary | ICD-10-CM | POA: Insufficient documentation

## 2018-10-01 DIAGNOSIS — Y999 Unspecified external cause status: Secondary | ICD-10-CM | POA: Insufficient documentation

## 2018-10-01 DIAGNOSIS — W2209XA Striking against other stationary object, initial encounter: Secondary | ICD-10-CM | POA: Insufficient documentation

## 2018-10-01 DIAGNOSIS — I10 Essential (primary) hypertension: Secondary | ICD-10-CM | POA: Insufficient documentation

## 2018-10-01 DIAGNOSIS — Z79899 Other long term (current) drug therapy: Secondary | ICD-10-CM | POA: Insufficient documentation

## 2018-10-01 DIAGNOSIS — Y929 Unspecified place or not applicable: Secondary | ICD-10-CM | POA: Insufficient documentation

## 2018-10-01 DIAGNOSIS — S91112A Laceration without foreign body of left great toe without damage to nail, initial encounter: Secondary | ICD-10-CM | POA: Insufficient documentation

## 2018-10-01 DIAGNOSIS — F17210 Nicotine dependence, cigarettes, uncomplicated: Secondary | ICD-10-CM | POA: Insufficient documentation

## 2018-10-01 MED ORDER — TRAMADOL HCL 50 MG PO TABS: 50.0000 mg | ORAL_TABLET | Freq: Four times a day (QID) | ORAL | 0 refills | Status: DC | PRN

## 2018-10-01 MED ORDER — OXYCODONE HCL 5 MG PO TABS
5.0000 mg | ORAL_TABLET | Freq: Once | ORAL | Status: AC
  Administered 2018-10-01: 5 mg via ORAL
  Filled 2018-10-01: qty 1

## 2018-10-01 MED ORDER — NAPROXEN 500 MG PO TABS: 500.0000 mg | ORAL_TABLET | Freq: Two times a day (BID) | ORAL | 0 refills | Status: DC

## 2018-10-01 NOTE — ED Triage Notes (Signed)
Patient states she stubbed her left great toe this morning and "split it open." Toe is wrapped with gauze and has blood showing through it.

## 2018-10-01 NOTE — ED Notes (Addendum)
See triage note  Presents with toe injury  States she stubbed her toe this am  Having pain to left great toe  Laceration noted at tip of toe

## 2018-10-01 NOTE — Discharge Instructions (Signed)
Keep wound dry for the next several days. Return to the ER for symptoms that change or worsen or for new concerns if unable to see primary care.

## 2018-10-02 NOTE — ED Provider Notes (Signed)
[facility] Emergency Department Provider Note  ____________________________________________  Time seen: Approximately 4:01 PM  I have reviewed the triage vital signs and the nursing notes.   HISTORY  Chief Complaint Toe Pain   HPI Kayla Conner is a 28 y.o. female presents to the emergency department for evaluation after hitting her great toe on the edge of a step earlier today causing the skin to "split open."  She states that she poured some peroxide and water on it, but it is continued to bleed throughout the day.   Past Medical History:  Diagnosis Date  . Bipolar 1 disorder (HCC)   . Hypertension   . Miscarriage     Patient Active Problem List   Diagnosis Date Noted  . Alcohol abuse 07/07/2015  . Cocaine abuse (HCC) 07/07/2015  . Substance induced mood disorder (HCC) 07/07/2015    Past Surgical History:  Procedure Laterality Date  . CESAREAN SECTION      Prior to Admission medications   Medication Sig Start Date End Date Taking? Authorizing Provider  citalopram (CELEXA) 20 MG tablet Take 20 mg by mouth daily.    [provider]  naproxen (NAPROSYN) 500 MG tablet Take 1 tablet (500 mg total) by mouth 2 (two) times daily with a meal. 10/01/18   Willo Yoon B, FNP  traMADol (ULTRAM) 50 MG tablet Take 1 tablet (50 mg total) by mouth every 6 (six) hours as needed. 10/01/18   Chinita Pesterriplett, Miyani Cronic B, FNP    Allergies Patient has no known allergies.  No family history on file.  Social History Social History   Tobacco Use  . Smoking status: Current Every Day Smoker  . Smokeless tobacco: Never Used  Substance Use Topics  . Alcohol use: Yes  . Drug use: Yes    Types: Marijuana    Review of Systems  Constitutional: Negative for fever. Respiratory: Negative for cough or shortness of breath.  Musculoskeletal: Negative for myalgias Skin: Positive for great toe injury Neurological: Negative for numbness or  paresthesias. ____________________________________________   PHYSICAL EXAM:  VITAL SIGNS: ED Triage Vitals [10/01/18 1541]  Enc Vitals Group     BP (!) 133/110     Pulse Rate 92     Resp 18     Temp 98.3 F (36.8 C)     Temp Source Oral     SpO2 100 %     Weight      Height      Head Circumference      Peak Flow      Pain Score 10     Pain Loc      Pain Edu?      Excl. in GC?      Constitutional: Well appearing. Eyes: Conjunctivae are clear without discharge or drainage. Nose: No rhinorrhea noted. Mouth/Throat: Airway is patent.  Neck: No stridor. Unrestricted range of motion observed. Cardiovascular: Capillary refill is <3 seconds.  Respiratory: Respirations are even and unlabored.. Musculoskeletal: Unrestricted range of motion observed. Neurologic: Awake, alert, and oriented x 4.  Skin: 2.5 cm superficial laceration to the tip of the left great toe.  Area continues to ooze.  ____________________________________________   LABS (all labs ordered are listed, but only abnormal results are displayed)  Labs Reviewed - No data to display ____________________________________________  EKG  Not indicated. ____________________________________________  RADIOLOGY  Not indicated ____________________________________________   PROCEDURES  .Marland Kitchen.Laceration Repair  Date/Time: 10/02/2018 4:03 PM Performed by: Chinita Pesterriplett, Samik Balkcom B, FNP Authorized by: Chinita Pesterriplett, Deryl Giroux B,  FNP   Consent:    Consent obtained:  Verbal   Consent given by:  Patient   Risks discussed:  Infection, pain, poor cosmetic result and poor wound healing Anesthesia (see MAR for exact dosages):    Anesthesia method:  Topical application   Topical anesthesia: Pain spray. Laceration details:    Location:  Toe   Toe location:  L big toe   Length (cm):  2.5 Repair type:    Repair type:  Simple Exploration:    Hemostasis achieved with:  Direct pressure   Contaminated: no   Treatment:    Area cleansed  with:  Hibiclens and saline   Amount of cleaning:  Standard   Irrigation method:  Syringe Skin repair:    Repair method:  Tissue adhesive Approximation:    Approximation:  Loose Post-procedure details:    Dressing:  Non-adherent dressing   Patient tolerance of procedure:  Tolerated well, no immediate complications   ____________________________________________   INITIAL IMPRESSION / ASSESSMENT AND PLAN / ED COURSE  Kayla Conner is a 28 y.o. female presents to the emergency department for repair of wound to the left great toe.  Wound had been open for several hours prior to presentation to the emergency department.  Wound is more of a deep skin tear and it is unlikely that sutures will be of any benefit.  The wound was cleansed as above then wound edges were reapproximated fairly well and secured with Dermabond.  She was placed in a cast shoe after a sterile bandage was applied.  Wound care was discussed with the patient.  She has crutches and was instructed to continue using this for the next few days.  She was advised to elevate her foot as often as possible for the next few days to allow for healing.  She was advised to return to the emergency department for any concern or sign of infection.  She reports that her tetanus is current.   Medications  oxyCODONE (Oxy IR/ROXICODONE) immediate release tablet 5 mg (5 mg Oral Given 10/01/18 1721)     Pertinent labs & imaging results that were available during my care of the patient were reviewed by me and considered in my medical decision making (see chart for details).  ____________________________________________   FINAL CLINICAL IMPRESSION(S) / ED DIAGNOSES  Final diagnoses:  Laceration of left great toe without foreign body present or damage to nail, initial encounter    ED Discharge Orders         Ordered    traMADol (ULTRAM) 50 MG tablet  Every 6 hours PRN     10/01/18 1832    naproxen (NAPROSYN) 500 MG tablet  2  times daily with meals     10/01/18 1832           Note:  This document was prepared using Dragon voice recognition software and may include unintentional dictation errors.   Victorino Dike, FNP 10/02/18 1607    Schuyler Amor, MD 10/02/18 [phone]

## 2019-04-05 ENCOUNTER — Telehealth: Payer: Self-pay | Admitting: *Deleted

## 2019-04-05 ENCOUNTER — Other Ambulatory Visit: Payer: Self-pay

## 2019-04-05 DIAGNOSIS — Z20822 Contact with and (suspected) exposure to covid-19: Secondary | ICD-10-CM

## 2019-04-05 NOTE — Telephone Encounter (Signed)
Patient given negative covid results . 

## 2019-04-06 ENCOUNTER — Telehealth: Payer: Self-pay

## 2019-04-06 LAB — NOVEL CORONAVIRUS, NAA: SARS-CoV-2, NAA: DETECTED — AB

## 2019-04-06 NOTE — Telephone Encounter (Signed)
Pt called back and informed her that covid test results are not back and that since she is active in MyChart, she will be notified as soon as the results are completed. Pt verbalized understanding.

## 2019-04-06 NOTE — Telephone Encounter (Signed)
Received call from patient checking Covid results.  Advised results are positive.  Advised to contact PCP or visit ED if symptoms worsen.   

## 2019-04-06 NOTE — Telephone Encounter (Signed)
Caller advise that results are not back yet 

## 2019-04-09 ENCOUNTER — Telehealth: Payer: Self-pay | Admitting: *Deleted

## 2019-12-14 ENCOUNTER — Emergency Department
Admission: EM | Admit: 2019-12-14 | Discharge: 2019-12-14 | Disposition: A | Discharge status: Home / Self Care | Payer: Self-pay | Attending: Emergency Medicine | Admitting: Emergency Medicine

## 2019-12-14 ENCOUNTER — Other Ambulatory Visit: Payer: Self-pay

## 2019-12-14 DIAGNOSIS — Z20822 Contact with and (suspected) exposure to covid-19: Secondary | ICD-10-CM | POA: Insufficient documentation

## 2019-12-14 DIAGNOSIS — N939 Abnormal uterine and vaginal bleeding, unspecified: Secondary | ICD-10-CM | POA: Insufficient documentation

## 2019-12-14 DIAGNOSIS — R05 Cough: Secondary | ICD-10-CM | POA: Insufficient documentation

## 2019-12-14 DIAGNOSIS — F17200 Nicotine dependence, unspecified, uncomplicated: Secondary | ICD-10-CM | POA: Insufficient documentation

## 2019-12-14 LAB — COMPREHENSIVE METABOLIC PANEL
ALT: 15 U/L (ref 0–44)
AST: 24 U/L (ref 15–41)
Albumin: 4.2 g/dL (ref 3.5–5.0)
Alkaline Phosphatase: 53 U/L (ref 38–126)
Anion gap: 9 (ref 5–15)
BUN: 10 mg/dL (ref 6–20)
CO2: 26 mmol/L (ref 22–32)
Calcium: 9 mg/dL (ref 8.9–10.3)
Chloride: 101 mmol/L (ref 98–111)
Creatinine, Ser: 0.89 mg/dL (ref 0.44–1.00)
GFR calc Af Amer: >60 mL/min (ref >60)
GFR calc non Af Amer: >60 mL/min (ref >60)
Glucose, Bld: 91 mg/dL (ref 70–99)
Potassium: 3.4 mmol/L — ABNORMAL LOW (ref 3.5–5.1)
Sodium: 136 mmol/L (ref 135–145)
Total Bilirubin: 0.7 mg/dL (ref 0.3–1.2)
Total Protein: 7.5 g/dL (ref 6.5–8.1)

## 2019-12-14 LAB — CBC
HCT: 38.5 % (ref 36.0–46.0)
Hemoglobin: 12.8 g/dL (ref 12.0–15.0)
MCH: 26.9 pg (ref 26.0–34.0)
MCHC: 33.2 g/dL (ref 30.0–36.0)
MCV: 80.9 fL (ref 80.0–100.0)
Platelets: 312 10*3/uL (ref 150–400)
RBC: 4.76 MIL/uL (ref 3.87–5.11)
RDW: 14 % (ref 11.5–15.5)
WBC: 4.9 10*3/uL (ref 4.0–10.5)
nRBC: 0 % (ref 0.0–0.2)

## 2019-12-14 LAB — URINALYSIS, COMPLETE (UACMP) WITH MICROSCOPIC
Bilirubin Urine: NEGATIVE
Glucose, UA: NEGATIVE mg/dL
Ketones, ur: 5 mg/dL — AB
Leukocytes,Ua: NEGATIVE
Nitrite: NEGATIVE
Protein, ur: 100 mg/dL — AB
Specific Gravity, Urine: 1.024 (ref 1.005–1.030)
pH: 5 (ref 5.0–8.0)

## 2019-12-14 LAB — POCT PREGNANCY, URINE: Preg Test, Ur: NEGATIVE

## 2019-12-14 LAB — LIPASE, BLOOD: Lipase: 27 U/L (ref 11–51)

## 2019-12-14 LAB — TSH: TSH: 0.382 u[IU]/mL (ref 0.350–4.500)

## 2019-12-14 LAB — SARS CORONAVIRUS 2 BY RT PCR (HOSPITAL ORDER, PERFORMED IN ~~LOC~~ HOSPITAL LAB): SARS Coronavirus 2: NEGATIVE

## 2019-12-14 NOTE — ED Triage Notes (Signed)
Pt comes with c/o vaginal bleeding since yesterday with clots. Pt unsure if pregnant. Pt states recent irregular menstrual cycle.  Pt also states some nausea. Pt states cough, runny nose.

## 2019-12-14 NOTE — Discharge Instructions (Addendum)
Follow-up with your regular doctor if not improving in 2 to 3 days.  Return emergency department worsening.  Covid test should result later today.  If positive you should remain quarantined for 14 days.  If negative may go back to daily activities

## 2019-12-14 NOTE — ED Provider Notes (Signed)
[facility] Emergency Department Provider Note  ____________________________________________   First MD Initiated Contact with Patient 12/14/19 1317     (approximate)  I have reviewed the triage vital signs and the nursing notes.   HISTORY  Chief Complaint Vaginal Bleeding    HPI Kayla Conner is a 29 y.o. female presents emergency department with irregular vaginal bleeding  and some chest congestion.  Patient states that she is unsure if she is pregnant.  She had regular menstrual cycle about 2 weeks ago and then had this irregular bleeding today.  States she does not bleed much but is a tampon in.  Denies any trauma to the vaginal area.  Denies vaginal discharge or STD but states "you never know".  States that she was exposed to Covid by her aunt and uncle.   History reviewed. No pertinent past medical history.  There are no problems to display for this patient.   History reviewed. No pertinent surgical history.  Prior to Admission medications   Not on File    Allergies Patient has no allergy information on record.  No family history on file.  Social History Social History   Tobacco Use  . Smoking status: Current Every Day Smoker  . Smokeless tobacco: Never Used  Substance Use Topics  . Alcohol use: Yes    Comment: occ  . Drug use: Yes    Types: Marijuana, Cocaine    Comment: crack 2 days ago    Review of Systems  Constitutional: Denies fever/chills Eyes: No visual changes. ENT: No sore throat. Respiratory: Positive cough Cardiovascular: Denies chest pain Gastrointestinal: Denies abdominal pain Genitourinary: Negative for dysuria.  Positive vaginal bleeding Musculoskeletal: Negative for back pain. Skin: Negative for rash. Psychiatric: no mood changes,     ____________________________________________   PHYSICAL EXAM:  VITAL SIGNS: ED Triage Vitals [12/14/19 1120]  Enc Vitals Group     BP 132/90     Pulse Rate 80      Resp 19     Temp 98.5 F (36.9 C)     Temp src      SpO2 100 %     Weight 122 lb (55.3 kg)     Height 5\' 8"  (1.727 m)     Head Circumference      Peak Flow      Pain Score 6     Pain Loc      Pain Edu?      Excl. in GC?     Constitutional: Alert and oriented. Well appearing and in no acute distress. Eyes: Conjunctivae are normal.  Head: Atraumatic. Nose: No congestion/rhinnorhea. Mouth/Throat: Mucous membranes are moist.   Neck:  supple no lymphadenopathy noted Cardiovascular: Normal rate, regular rhythm. Heart sounds are normal Respiratory: Normal respiratory effort.  No retractions, lungs c t a  Abd: soft nontender bs normal all 4 quad GU: Unable to perform as patient decided to leave prior to the pelvic exam Musculoskeletal: FROM all extremities, warm and well perfused Neurologic:  Normal speech and language.  Skin:  Skin is warm, dry and intact. No rash noted. Psychiatric: Mood and affect are normal. Speech and behavior are normal.  ____________________________________________   LABS (all labs ordered are listed, but only abnormal results are displayed)  Labs Reviewed  COMPREHENSIVE METABOLIC PANEL - Abnormal; Notable for the following components:      Result Value   Potassium 3.4 (*)    All other components within normal limits  URINALYSIS, COMPLETE (UACMP) WITH MICROSCOPIC -  Abnormal; Notable for the following components:   Color, Urine YELLOW (*)    APPearance HAZY (*)    Hgb urine dipstick SMALL (*)    Ketones, ur 5 (*)    Protein, ur 100 (*)    Bacteria, UA RARE (*)    All other components within normal limits  WET PREP, GENITAL  CHLAMYDIA/NGC RT PCR ([facility] ONLY)  SARS CORONAVIRUS 2 BY RT PCR (HOSPITAL ORDER, PERFORMED IN [facility])  LIPASE, BLOOD  CBC  TSH  POCT PREGNANCY, URINE  POC URINE PREG, ED    ____________________________________________   ____________________________________________  RADIOLOGY    ____________________________________________   PROCEDURES  Procedure(s) performed: No  Procedures    ____________________________________________   INITIAL IMPRESSION / ASSESSMENT AND PLAN / ED COURSE  Pertinent labs & imaging results that were available during my care of the patient were reviewed by me and considered in my medical decision making (see chart for details).   Patient is 29 year old female presents emergency department with concerns of Covid and vaginal bleeding.  Physical exam shows patient to appear well.  Vitals are stable.  Exam is unremarkable.  Unable to perform pelvic exam as patient left prior to me completing this part.  She states she just wanted to go home but did let the tech do the Covid swab.  TSH, lipase, CBC are all normal, POC pregnancy is negative, comprehensive metabolic panel is normal, UA is normal, Covid test still pending, unable to perform the wet prep and GC/chlamydia this patient left prior to the pelvic exam.   covid test is negative  Patient informed nursing staff that she was leaving.      [name] was evaluated in Emergency Department on 12/14/2019 for the symptoms described in the history of present illness. She was evaluated in the context of the global COVID-19 pandemic, which necessitated consideration that the patient might be at risk for infection with the SARS-CoV-2 virus that causes COVID-19. Institutional protocols and algorithms that pertain to the evaluation of patients at risk for COVID-19 are in a state of rapid change based on information released by regulatory bodies including the CDC and federal and state organizations. These policies and algorithms were followed during the patient's care in the ED.    As part of my medical decision making, I reviewed the following data within the electronic  MEDICAL RECORD NUMBER Nursing notes reviewed and incorporated, Labs reviewed , Old chart reviewed, Notes from prior ED visits and [place] Controlled Substance Database  ____________________________________________   FINAL CLINICAL IMPRESSION(S) / ED DIAGNOSES  Final diagnoses:  Suspected COVID-19 virus infection  Vaginal bleeding      NEW MEDICATIONS STARTED DURING THIS VISIT:  New Prescriptions   No medications on file     Note:  This document was prepared using Dragon voice recognition software and may include unintentional dictation errors.    Faythe Ghee, PA-C 12/14/19 1540    Delton [place], MD 12/14/19 [phone]

## 2019-12-14 NOTE — ED Triage Notes (Signed)
Pt in via [facility] with c/o generalized weakness and syncopal episode this am. Pt also with vaginal bleeding and passed 2 cloths this am.

## 2020-03-21 ENCOUNTER — Ambulatory Visit: Payer: Self-pay | Attending: Oncology

## 2020-06-28 NOTE — Progress Notes (Signed)
Opened in error - please disregard

## 2020-10-02 ENCOUNTER — Other Ambulatory Visit: Payer: Self-pay

## 2020-10-02 ENCOUNTER — Emergency Department
Admission: EM | Admit: 2020-10-02 | Discharge: 2020-10-02 | Disposition: A | Discharge status: Home / Self Care | Payer: Medicaid Other | Attending: Emergency Medicine | Admitting: Emergency Medicine

## 2020-10-02 DIAGNOSIS — F22 Delusional disorders: Secondary | ICD-10-CM | POA: Insufficient documentation

## 2020-10-02 DIAGNOSIS — F17210 Nicotine dependence, cigarettes, uncomplicated: Secondary | ICD-10-CM | POA: Insufficient documentation

## 2020-10-02 MED ORDER — ARTIFICIAL TEARS OPHTHALMIC OINT
TOPICAL_OINTMENT | Freq: Once | OPHTHALMIC | Status: DC
  Filled 2020-10-02: qty 3.5

## 2020-10-02 MED ORDER — DIPHENHYDRAMINE HCL 50 MG/ML IJ SOLN
50.0000 mg | Freq: Once | INTRAMUSCULAR | Status: AC
  Administered 2020-10-02: 04:00:00 50 mg via INTRAMUSCULAR
  Filled 2020-10-02: qty 1

## 2020-10-02 NOTE — ED Triage Notes (Addendum)
Pt states yesterday had the sensation of something crawling into her left ear. Pt states then began to feel like something was crawling near her eyes. Pt states now feels like "its crawling all over my body". Pt denies fever, itching or known rash. Pt states she feels things crawling around in her eyes.

## 2020-10-02 NOTE — ED Provider Notes (Signed)
[facility] Emergency Department Provider Note  ____________________________________________  Time seen: Approximately 3:42 AM  I have reviewed the triage vital signs and the nursing notes.   HISTORY  Chief Complaint No chief complaint on file.   HPI Josilyn Shippee is a 30 y.o. female who presents for evaluation of concerns of diffuse parasitic infection.  Patient reports that yesterday she was smoking cigarettes when she felt something crawling into her left ear.  Since then she has felt that there is something crawling in both her eyes and all over her body.  She does endorse smoking crack cocaine yesterday.  Denies any prior history of delusional parasitosis.  Denies any new medications or rash.  She reports that she is unable to stop scratching both her eyes.  PMH Crack cocaine abuse  Allergies Patient has no known allergies.  No family history on file.  Social History Social History   Tobacco Use   Smoking status: Every Day    Pack years: 0.00   Smokeless tobacco: Never  Substance Use Topics   Alcohol use: Yes    Comment: occ   Drug use: Yes    Types: Marijuana, Cocaine    Comment: crack 2 days ago    Review of Systems  Constitutional: Negative for fever. Eyes: Negative for visual changes. ENT: Negative for sore throat. Neck: No neck pain  Cardiovascular: Negative for chest pain. Respiratory: Negative for shortness of breath. Gastrointestinal: Negative for abdominal pain, vomiting or diarrhea. Genitourinary: Negative for dysuria. Musculoskeletal: Negative for back pain. Skin: Negative for rash.  Neurological: Negative for headaches, weakness or numbness. Psych: No SI or HI. + feeling of creatures crawling all over her  ____________________________________________   PHYSICAL EXAM:  VITAL SIGNS: ED Triage Vitals [10/02/20 0316]  Enc Vitals Group     BP (!) 128/104     Pulse Rate (!) 101     Resp 16     Temp 98.3 F  (36.8 C)     Temp Source Oral     SpO2 100 %     Weight 120 lb (54.4 kg)     Height 5\' 8"  (1.727 m)     Head Circumference      Peak Flow      Pain Score      Pain Loc      Pain Edu?      Excl. in GC?     Constitutional: Alert and oriented, actively rubbing her eyes. HEENT:      Head: Normocephalic and atraumatic.         Eyes: Conjunctivae are normal. Sclera is non-icteric. PERRL.       Ears: b/l TMs visualized and clear, no foreign body or insects in the external ear,      Mouth/Throat: Mucous membranes are moist.       Neck: Supple with no signs of meningismus. Cardiovascular: Regular rate and rhythm.  Respiratory: Normal respiratory effort.  Gastrointestinal: Soft, non tender, and non distended. Musculoskeletal: No edema, cyanosis, or erythema of extremities. Neurologic: Normal speech and language. Face is symmetric. Moving all extremities. No gross focal neurologic deficits are appreciated. Skin: Skin is warm, dry and intact. No rash noted. Psychiatric: Mood and affect are normal. Speech and behavior are normal.  ____________________________________________   LABS (all labs ordered are listed, but only abnormal results are displayed)  Labs Reviewed - No data to display ____________________________________________  EKG  none  ____________________________________________  RADIOLOGY  none  ____________________________________________   PROCEDURES  Procedure(s) performed: None Procedures Critical Care performed:  None ____________________________________________   INITIAL IMPRESSION / ASSESSMENT AND PLAN / ED COURSE  30 y.o. female who presents for evaluation of concerns of diffuse parasitic infection.  Patient's presentation is concerning for delusional parasitosis.  A full-body exam was performed with no parasites seen.  TMs were visualized with no insects in the external ear canals.  Patient underwent an eye exam which was also normal other than dry eyes  for which she was given artificial tears.  We gave her an IM dose of Benadryl.  Explained to the patient that this is most likely delusional induced from crack use.  Patient was really upset and told me "I am sure this is nothing to do with crack, I am going to [facility] to figure it out what I have."  Patient refused psychiatric evaluation.      Please note:  Patient was evaluated in Emergency Department today for the symptoms described in the history of present illness. Patient was evaluated in the context of the global COVID-19 pandemic, which necessitated consideration that the patient might be at risk for infection with the SARS-CoV-2 virus that causes COVID-19. Institutional protocols and algorithms that pertain to the evaluation of patients at risk for COVID-19 are in a state of rapid change based on information released by regulatory bodies including the CDC and federal and state organizations. These policies and algorithms were followed during the patient's care in the ED.  Some ED evaluations and interventions may be delayed as a result of limited staffing during the pandemic.  __________________________________________   FINAL CLINICAL IMPRESSION(S) / ED DIAGNOSES   Final diagnoses:  Delusions of parasitosis (HCC)      NEW MEDICATIONS STARTED DURING THIS VISIT:  ED Discharge Orders     None        Note:  This document was prepared using Dragon voice recognition software and may include unintentional dictation errors.    Don Perking, [place], MD 10/02/20 [phone]

## 2020-10-02 NOTE — ED Notes (Addendum)
Patient reports "I dont know why I keep coming here if they are just going to tell me nothing is wrong and then I go to [facility] and they tell me everything is wrong and that I am almost dying; I know it's not the drugs, the drugs don't do this to me and I know there is someone seriously wrong". Patient agitated. Scratching at arms and head. Respirations even and unlabored. NAD.

## 2020-10-03 ENCOUNTER — Ambulatory Visit
Admission: EM | Admit: 2020-10-03 | Discharge: 2020-10-03 | Disposition: A | Discharge status: Home / Self Care | Payer: Self-pay | Attending: Sports Medicine | Admitting: Sports Medicine

## 2020-10-03 ENCOUNTER — Other Ambulatory Visit: Payer: Self-pay

## 2020-10-03 DIAGNOSIS — H5789 Other specified disorders of eye and adnexa: Secondary | ICD-10-CM

## 2020-10-03 DIAGNOSIS — H1033 Unspecified acute conjunctivitis, bilateral: Secondary | ICD-10-CM

## 2020-10-03 HISTORY — DX: Alcohol abuse, uncomplicated: F10.10

## 2020-10-03 HISTORY — DX: Other psychoactive substance use, unspecified, uncomplicated: F19.90

## 2020-10-03 MED ORDER — ERYTHROMYCIN 5 MG/GM OP OINT: TOPICAL_OINTMENT | OPHTHALMIC | 0 refills | Status: DC

## 2020-10-03 NOTE — Discharge Instructions (Addendum)
As we discussed, your eye exam shows no issues with your retina or the blood vessels in the back of your eye.  You do have some discharge and I believe that foreign body sensation that you are feeling is due to that.  I am going to treat you for bacterial process with an antibiotic ointment.  You can pick that up at your pharmacy. Please see the educational handouts provided. If your symptoms persist please go to your primary care provider.  If they worsen then please go to the emergency room.

## 2020-10-03 NOTE — ED Provider Notes (Signed)
[facility]    CSN: [id number] Arrival date & time: 10/03/20  1034      History   Chief Complaint Chief Complaint  Patient presents with   Eye Problem    bilateral    HPI Kayla Conner is a 30 y.o. female.   Patient is a 30 year old female who presents for evaluation of the above issue.  She has 2 medical records and they are currently flagged to be merged.  I have reviewed both medical records extensively.  She has a long history of substance abuse and has been seen in the emergency room for this on multiple occasions.  She was recently seen yesterday at [facility] after complaining of a sensation that an insect crawled into her ear.  She was diagnosed with delusions secondary to crack cocaine use.  She comes in today saying that she feels a sensation in both eyes and points to the medial canthus.  She also reports having some discharge and drainage that is yellowish in nature.  The issues with her ears have resolved.  She denies any recent crack cocaine use or alcohol use.  She says that she only does that a few times a week and has been several days since she did either.  She denies any painful vision, blurry vision, or double vision.  Again she is very tearful and asking somebody to help her.  She reports that her primary care needs are met with the [facility] in [place].  She has not seen them for this.  She denies chest pain shortness of breath.  She also denies suicidal ideation.  She denies wanting to injure herself or others.  No red flag signs or symptoms elicited on history.    Past Medical History:  Diagnosis Date   Alcohol abuse    Bipolar 1 disorder ([place])    Drug use    Hypertension    Miscarriage     Patient Active Problem List   Diagnosis Date Noted   Alcohol abuse 07/07/2015   Cocaine abuse ([place]) 07/07/2015   Substance induced mood disorder ([place]) 07/07/2015    Past Surgical History:  Procedure Laterality Date   CESAREAN  SECTION      OB History   No obstetric history on file.      Home Medications    Prior to Admission medications   Medication Sig Start Date End Date Taking? Authorizing Provider  erythromycin ophthalmic ointment Place a 1/2 inch ribbon of ointment into the lower eyelid. 10/03/20  Yes Verda Cumins, MD  citalopram (CELEXA) 20 MG tablet Take 20 mg by mouth daily.    [provider]  naproxen (NAPROSYN) 500 MG tablet Take 1 tablet (500 mg total) by mouth 2 (two) times daily with a meal. 10/01/18   Triplett, Cari B, FNP  traMADol (ULTRAM) 50 MG tablet Take 1 tablet (50 mg total) by mouth every 6 (six) hours as needed. 10/01/18   Victorino Dike, FNP    Family History History reviewed. No pertinent family history.  Social History Social History   Tobacco Use   Smoking status: Every Day    Pack years: 0.00   Smokeless tobacco: Never  Vaping Use   Vaping Use: Every day  Substance Use Topics   Alcohol use: Yes   Drug use: Yes    Types: Marijuana, Cocaine     Allergies   Patient has no known allergies.   Review of Systems Review of Systems  Constitutional:  Negative  for activity change, appetite change, chills, diaphoresis, fatigue and fever.  HENT:  Negative for congestion, ear discharge, ear pain, postnasal drip, rhinorrhea, sinus pressure, sinus pain, sneezing and sore throat.   Eyes:  Positive for pain, discharge and redness. Negative for photophobia, itching and visual disturbance.  Respiratory:  Negative for cough, chest tightness and shortness of breath.   Cardiovascular:  Negative for chest pain and palpitations.  Gastrointestinal:  Negative for abdominal pain, diarrhea, nausea and vomiting.  Genitourinary:  Negative for dysuria.  Musculoskeletal:  Negative for back pain, myalgias and neck pain.  Skin:  Negative for color change, pallor, rash and wound.  Neurological:  Negative for dizziness, syncope, light-headedness and headaches.  All other systems  reviewed and are negative.   Physical Exam Triage Vital Signs ED Triage Vitals [10/03/20 1105]  Enc Vitals Group     BP      Pulse      Resp      Temp      Temp src      SpO2      Weight 120 lb (54.4 kg)     Height '5\' 8"'  (1.727 m)     Head Circumference      Peak Flow      Pain Score 10     Pain Loc      Pain Edu?      Excl. in [place]?    No data found.  Updated Vital Signs BP (!) 136/96 (BP Location: Left Arm)   Pulse 93   Temp 98.4 F (36.9 C) (Oral)   Resp 18   Ht '5\' 8"'  (1.727 m)   Wt 54.4 kg   LMP 10/02/2020   SpO2 99%   BMI 18.25 kg/m   Visual Acuity Right Eye Distance:   Left Eye Distance:   Bilateral Distance:    Right Eye Near:   Left Eye Near:    Bilateral Near:     Physical Exam Vitals and nursing note reviewed.  Constitutional:      General: She is not in acute distress.    Appearance: Normal appearance. She is not ill-appearing, toxic-appearing or diaphoretic.  HENT:     Head: Normocephalic and atraumatic.     Right Ear: Tympanic membrane normal.     Left Ear: Tympanic membrane normal.     Nose: Nose normal.     Mouth/Throat:     Mouth: Mucous membranes are moist.  Eyes:     General: Vision grossly intact. No scleral icterus.       Right eye: Discharge present. No foreign body or hordeolum.        Left eye: Discharge present.No foreign body or hordeolum.     Extraocular Movements: Extraocular movements intact.     Right eye: Normal extraocular motion and no nystagmus.     Left eye: Normal extraocular motion and no nystagmus.     Conjunctiva/sclera:     Right eye: Right conjunctiva is injected.     Left eye: Left conjunctiva is injected.     Pupils: Pupils are equal, round, and reactive to light.  Cardiovascular:     Rate and Rhythm: Normal rate and regular rhythm.     Pulses: Normal pulses.     Heart sounds: Normal heart sounds. No murmur heard.   No friction rub. No gallop.  Pulmonary:     Effort: Pulmonary effort is normal.      Breath sounds: Normal breath sounds. No stridor. No wheezing, rhonchi or rales.  Musculoskeletal:     Cervical back: Normal range of motion and neck supple.  Skin:    General: Skin is warm and dry.     Capillary Refill: Capillary refill takes less than 2 seconds.  Neurological:     General: No focal deficit present.     Mental Status: She is alert and oriented to person, place, and time.     UC Treatments / Results  Labs (all labs ordered are listed, but only abnormal results are displayed) Labs Reviewed - No data to display  EKG   Radiology No results found.  Procedures Procedures (including critical care time)  Medications Ordered in UC Medications - No data to display  Initial Impression / Assessment and Plan / UC Course  I have reviewed the triage vital signs and the nursing notes.  Pertinent labs & imaging results that were available during my care of the patient were reviewed by me and considered in my medical decision making (see chart for details).  Clinical impression: 1.  Bilateral eye irritation with discharge consistent with possible bacterial conjunctivitis. 2.  Recent ER visit for similar sensation in her ear and diagnosed with delusion. 3.  History of polysubstance abuse with multiple ER visits.  Patient does have multiple charts that need to be combined.  Treatment plan: 1.  The findings and treatment plan were discussed in detail with the patient.  Patient was in agreement. 2.  Patient was not suicidal and did not endorse harming herself or others.  I felt she was fine to be discharged from the clinic. 3.  We will treat her for a bacterial conjunctivitis with erythromycin. 4.  Educational handouts provided. 5.  Asked her to follow-up with her primary care provider. 6.  Her symptoms persisted I gave her the number of [facility] she can call and make the appointment on her own. 7.  If her symptoms were to worsen then I have asked her to seek out care at  a emergency room of her choosing.  She voiced verbal understanding. 8.  She was stable upon discharge and she will follow-up here as needed.     Final Clinical Impressions(s) / UC Diagnoses   Final diagnoses:  Acute bacterial conjunctivitis of both eyes  Eye irritation  Discharge from eye     Discharge Instructions      As we discussed, your eye exam shows no issues with your retina or the blood vessels in the back of your eye.  You do have some discharge and I believe that foreign body sensation that you are feeling is due to that.  I am going to treat you for bacterial process with an antibiotic ointment.  You can pick that up at your pharmacy. Please see the educational handouts provided. If your symptoms persist please go to your primary care provider.  If they worsen then please go to the emergency room.     ED Prescriptions     Medication Sig Dispense Auth. Provider   erythromycin ophthalmic ointment Place a 1/2 inch ribbon of ointment into the lower eyelid. 3.5 g Verda Cumins, MD      PDMP not reviewed this encounter.   Verda Cumins, MD 10/03/20 1157

## 2020-10-03 NOTE — ED Triage Notes (Signed)
Pt c/o "something" going into her left ear on Saturday. Pt states that whatever it is has now crawled into her eyes. She reports "seeing worms in her eyes" and feels like they are crawling all over her, in her head and everywhere. Pt states she was seen at [facility] ED and they did not help her. Pt is very tearful and emotional during triage, holding her head and begging for someone to help her.

## 2021-03-29 ENCOUNTER — Other Ambulatory Visit: Payer: Self-pay

## 2021-03-29 ENCOUNTER — Encounter: Payer: Self-pay | Admitting: Emergency Medicine

## 2021-03-29 ENCOUNTER — Emergency Department: Payer: Self-pay

## 2021-03-29 ENCOUNTER — Emergency Department
Admission: EM | Admit: 2021-03-29 | Discharge: 2021-03-29 | Disposition: A | Discharge status: Home / Self Care | Payer: Self-pay | Attending: Emergency Medicine | Admitting: Emergency Medicine

## 2021-03-29 DIAGNOSIS — Z79899 Other long term (current) drug therapy: Secondary | ICD-10-CM | POA: Insufficient documentation

## 2021-03-29 DIAGNOSIS — K5900 Constipation, unspecified: Secondary | ICD-10-CM | POA: Insufficient documentation

## 2021-03-29 DIAGNOSIS — I10 Essential (primary) hypertension: Secondary | ICD-10-CM | POA: Insufficient documentation

## 2021-03-29 DIAGNOSIS — F17200 Nicotine dependence, unspecified, uncomplicated: Secondary | ICD-10-CM | POA: Insufficient documentation

## 2021-03-29 DIAGNOSIS — Z20822 Contact with and (suspected) exposure to covid-19: Secondary | ICD-10-CM | POA: Insufficient documentation

## 2021-03-29 LAB — URINE DRUG SCREEN, QUALITATIVE (ARMC ONLY)
Amphetamines, Ur Screen: NOT DETECTED
Barbiturates, Ur Screen: NOT DETECTED
Benzodiazepine, Ur Scrn: NOT DETECTED
Cannabinoid 50 Ng, Ur ~~LOC~~: NOT DETECTED
Cocaine Metabolite,Ur ~~LOC~~: POSITIVE — AB
MDMA (Ecstasy)Ur Screen: NOT DETECTED
Methadone Scn, Ur: NOT DETECTED
Opiate, Ur Screen: NOT DETECTED
Phencyclidine (PCP) Ur S: NOT DETECTED
Tricyclic, Ur Screen: NOT DETECTED

## 2021-03-29 LAB — RESP PANEL BY RT-PCR (FLU A&B, COVID) ARPGX2
Influenza A by PCR: NEGATIVE
Influenza B by PCR: NEGATIVE
SARS Coronavirus 2 by RT PCR: NEGATIVE

## 2021-03-29 LAB — COMPREHENSIVE METABOLIC PANEL
ALT: 11 U/L (ref 0–44)
AST: 21 U/L (ref 15–41)
Albumin: 2.9 g/dL — ABNORMAL LOW (ref 3.5–5.0)
Alkaline Phosphatase: 48 U/L (ref 38–126)
Anion gap: 8 (ref 5–15)
BUN: 11 mg/dL (ref 6–20)
CO2: 27 mmol/L (ref 22–32)
Calcium: 8.6 mg/dL — ABNORMAL LOW (ref 8.9–10.3)
Chloride: 103 mmol/L (ref 98–111)
Creatinine, Ser: 0.74 mg/dL (ref 0.44–1.00)
GFR, Estimated: >60 mL/min (ref >60)
Glucose, Bld: 103 mg/dL — ABNORMAL HIGH (ref 70–99)
Potassium: 3.9 mmol/L (ref 3.5–5.1)
Sodium: 138 mmol/L (ref 135–145)
Total Bilirubin: 0.3 mg/dL (ref 0.3–1.2)
Total Protein: 7.1 g/dL (ref 6.5–8.1)

## 2021-03-29 LAB — URINALYSIS, COMPLETE (UACMP) WITH MICROSCOPIC
Bacteria, UA: NONE SEEN
Bilirubin Urine: NEGATIVE
Glucose, UA: NEGATIVE mg/dL
Hgb urine dipstick: NEGATIVE
Ketones, ur: NEGATIVE mg/dL
Leukocytes,Ua: NEGATIVE
Nitrite: NEGATIVE
Protein, ur: 30 mg/dL — AB
RBC / HPF: NONE SEEN RBC/hpf (ref 0–5)
Specific Gravity, Urine: >1.03 — ABNORMAL HIGH (ref 1.005–1.030)
pH: 6 (ref 5.0–8.0)

## 2021-03-29 LAB — LIPASE, BLOOD: Lipase: 28 U/L (ref 11–51)

## 2021-03-29 LAB — CBC WITH DIFFERENTIAL/PLATELET
Abs Immature Granulocytes: 0.01 10*3/uL (ref 0.00–0.07)
Basophils Absolute: 0 10*3/uL (ref 0.0–0.1)
Basophils Relative: 0 %
Eosinophils Absolute: 0.2 10*3/uL (ref 0.0–0.5)
Eosinophils Relative: 3 %
HCT: 32.3 % — ABNORMAL LOW (ref 36.0–46.0)
Hemoglobin: 10.3 g/dL — ABNORMAL LOW (ref 12.0–15.0)
Immature Granulocytes: 0 %
Lymphocytes Relative: 24 %
Lymphs Abs: 1.3 10*3/uL (ref 0.7–4.0)
MCH: 25.9 pg — ABNORMAL LOW (ref 26.0–34.0)
MCHC: 31.9 g/dL (ref 30.0–36.0)
MCV: 81.2 fL (ref 80.0–100.0)
Monocytes Absolute: 0.6 10*3/uL (ref 0.1–1.0)
Monocytes Relative: 11 %
Neutro Abs: 3.3 10*3/uL (ref 1.7–7.7)
Neutrophils Relative %: 62 %
Platelets: 501 10*3/uL — ABNORMAL HIGH (ref 150–400)
RBC: 3.98 MIL/uL (ref 3.87–5.11)
RDW: 13.6 % (ref 11.5–15.5)
WBC: 5.3 10*3/uL (ref 4.0–10.5)
nRBC: 0 % (ref 0.0–0.2)

## 2021-03-29 LAB — POC URINE PREG, ED: Preg Test, Ur: NEGATIVE

## 2021-03-29 MED ORDER — POLYETHYLENE GLYCOL 3350 17 G PO PACK: 17.0000 g | PACK | Freq: Every day | ORAL | 0 refills | Status: AC

## 2021-03-29 MED ORDER — KETOROLAC TROMETHAMINE 30 MG/ML IJ SOLN
15.0000 mg | Freq: Once | INTRAMUSCULAR | Status: AC
  Administered 2021-03-29: 15 mg via INTRAVENOUS
  Filled 2021-03-29: qty 1

## 2021-03-29 MED ORDER — SODIUM CHLORIDE 0.9 % IV BOLUS
1000.0000 mL | Freq: Once | INTRAVENOUS | Status: AC
  Administered 2021-03-29: 1000 mL via INTRAVENOUS

## 2021-03-29 MED ORDER — IOHEXOL 300 MG/ML  SOLN
80.0000 mL | Freq: Once | INTRAMUSCULAR | Status: AC | PRN
  Administered 2021-03-29: 80 mL via INTRAVENOUS
  Filled 2021-03-29: qty 80

## 2021-03-29 NOTE — ED Provider Notes (Signed)
[facility]  ____________________________________________   Event Date/Time   First MD Initiated Contact with Patient 03/29/21 [id number]     (approximate)  I have reviewed the triage vital signs and the nursing notes.   HISTORY  Chief Complaint Generalized Body Aches, Cough, and Vaginal discomfort    HPI Kayla Conner is a 30 y.o. female presents with abdominal pain.  Patient endorses pain diffusely, in the back in her abdomen and chest.  She cannot say exactly when it started with aches and may have been going on for several weeks.  Denies diarrhea or constipation.  Patient denies drug or alcohol use.  Denies urinary symptoms although is concerned about a warm coming from her vagina.  Patient is in significant distress at the time of my evaluation which limits history taking.         Past Medical History:  Diagnosis Date   Alcohol abuse    Bipolar 1 disorder ([facility])    Drug use    Hypertension    Miscarriage     Patient Active Problem List   Diagnosis Date Noted   Alcohol abuse 07/07/2015   Cocaine abuse ([facility]) 07/07/2015   Substance induced mood disorder ([facility]) 07/07/2015    Past Surgical History:  Procedure Laterality Date   CESAREAN SECTION      Prior to Admission medications   Medication Sig Start Date End Date Taking? Authorizing Provider  polyethylene glycol (MIRALAX) 17 g packet Take 17 g by mouth daily. 03/29/21 04/28/21 Yes Georga Hacking, MD  citalopram (CELEXA) 20 MG tablet Take 20 mg by mouth daily.    [provider]  erythromycin ophthalmic ointment Place a 1/2 inch ribbon of ointment into the lower eyelid. 10/03/20   Delton See, MD  naproxen (NAPROSYN) 500 MG tablet Take 1 tablet (500 mg total) by mouth 2 (two) times daily with a meal. 10/01/18   Triplett, Cari B, FNP  traMADol (ULTRAM) 50 MG tablet Take 1 tablet (50 mg total) by mouth every 6 (six) hours as needed. 10/01/18   Chinita Pester, FNP     Allergies Patient has no known allergies.  History reviewed. No pertinent family history.  Social History Social History   Tobacco Use   Smoking status: Every Day   Smokeless tobacco: Never  Vaping Use   Vaping Use: Every day  Substance Use Topics   Alcohol use: Yes    Comment: occ   Drug use: Yes    Types: Marijuana, Cocaine    Comment: crack 2 days ago    Review of Systems   Review of Systems  Constitutional:  Negative for appetite change, chills and fever.  Respiratory:  Positive for chest tightness and shortness of breath.   Cardiovascular:  Positive for chest pain.  Gastrointestinal:  Positive for abdominal pain.  Genitourinary:  Negative for dysuria.  All other systems reviewed and are negative.  Physical Exam Updated Vital Signs BP 114/73 (BP Location: Left Arm)   Pulse 94   Temp 98.8 F (37.1 C) (Oral)   Resp 17   Ht 5\' 8"  (1.727 m)   Wt 54.4 kg   LMP 03/12/2021 (Exact Date)   SpO2 98%   BMI 18.25 kg/m   Physical Exam Vitals and nursing note reviewed.  Constitutional:      General: She is in acute distress.     Appearance: Normal appearance.     Comments: Patient is very tearful, yelling in pain, does not want to lie  back in the bed secondary to pain  HENT:     Head: Normocephalic and atraumatic.  Eyes:     General: No scleral icterus.    Conjunctiva/sclera: Conjunctivae normal.  Cardiovascular:     Rate and Rhythm: Normal rate and regular rhythm.  Pulmonary:     Effort: Pulmonary effort is normal. No respiratory distress.     Breath sounds: No stridor.  Abdominal:     General: Abdomen is flat.     Palpations: Abdomen is soft.     Comments: Patient is minimally cooperative for abdominal exam, crying and tenses up when her abdomen is palpated, difficult to discern if there is guarding or tenderness  Musculoskeletal:        General: No deformity or signs of injury.     Cervical back: Normal range of motion.  Skin:    General: Skin is  dry.     Coloration: Skin is not jaundiced or pale.  Neurological:     General: No focal deficit present.     Mental Status: She is alert and oriented to person, place, and time. Mental status is at baseline.  Psychiatric:        Mood and Affect: Mood normal.        Behavior: Behavior normal.     LABS (all labs ordered are listed, but only abnormal results are displayed)  Labs Reviewed  URINALYSIS, COMPLETE (UACMP) WITH MICROSCOPIC - Abnormal; Notable for the following components:      Result Value   Specific Gravity, Urine >1.030 (*)    Protein, ur 30 (*)    All other components within normal limits  COMPREHENSIVE METABOLIC PANEL - Abnormal; Notable for the following components:   Glucose, Bld 103 (*)    Calcium 8.6 (*)    Albumin 2.9 (*)    All other components within normal limits  CBC WITH DIFFERENTIAL/PLATELET - Abnormal; Notable for the following components:   Hemoglobin 10.3 (*)    HCT 32.3 (*)    MCH 25.9 (*)    Platelets 501 (*)    All other components within normal limits  URINE DRUG SCREEN, QUALITATIVE ([facility] ONLY) - Abnormal; Notable for the following components:   Cocaine Metabolite,Ur [place] POSITIVE (*)    All other components within normal limits  RESP PANEL BY RT-PCR (FLU A&B, COVID) ARPGX2  LIPASE, BLOOD  POC URINE PREG, ED   ____________________________________________  EKG  NSR, nml axis, nml intervals, no acute ischemic changes  ____________________________________________  RADIOLOGY I, Randol Kern, personally viewed and evaluated these images (plain radiographs) as part of my medical decision making, as well as reviewing the written report by the radiologist.  ED MD interpretation:  I reviewed the CXR which does not show any acute cardiopulmonary process   I reviewed the CT abdomen pelvis which does not show any acute abnormality    ____________________________________________   PROCEDURES  Procedure(s) performed (including Critical  Care):  Procedures   ____________________________________________   INITIAL IMPRESSION / ASSESSMENT AND PLAN / ED COURSE     Patient is a 30 year old female presenting with abdominal pain.  At time of my initial evaluation patient is somewhat inconsolable, she has tearful and is minimally cooperative for exam.  She is difficult to get history from because she is so distressed.  Really not giving specifics about any of her symptoms.  Her abdominal exam is difficult as she tenses up all of her abdominal muscles and is crying.  Given the unclear clinical situation  opted to perform labs and a CAT scan.  On reevaluation patient is now lying comfortably and intermittently falling asleep.  She has a neighbor with her who notes that she had told him she had abdominal pain.  Her vital signs are reassuring and her labs are also reassuring.  No evidence of UTI hCG is negative no leukocytosis.  CT abdomen pelvis is read by radiology as showing delayed transit and small amount of free fluid in the pelvis likely physiologic.  Repeat abdominal exam patient's abdomen is benign.  Did obtain a UDS given the somewhat bizarre nature of the patient's presentation was is positive for cocaine.  Ultimately I believe she is stable for discharge.  Will prescribe MiraLAX for constipation.     ____________________________________________   FINAL CLINICAL IMPRESSION(S) / ED DIAGNOSES  Final diagnoses:  Constipation, unspecified constipation type     ED Discharge Orders          Ordered    polyethylene glycol (MIRALAX) 17 g packet  Daily        03/29/21 1204             Note:  This document was prepared using Dragon voice recognition software and may include unintentional dictation errors.    Georga Hacking, MD 03/29/21 [phone]

## 2021-03-29 NOTE — ED Notes (Signed)
Pt states that "they were supposed to check my vagina"- informed Dr Sidney Ace of pt's concerns

## 2021-03-29 NOTE — ED Triage Notes (Signed)
Pt c/o body aches and not feeling well. Pt states that she feels like it is her kidneys or something. Pt states that she is having some shortness of breath as well. Pt reports pain when coughing. Pt is in NAD.

## 2021-03-29 NOTE — ED Notes (Signed)
Family member at bedside. Pt now stating she is drowsy. Pt not crying at this time.

## 2021-03-29 NOTE — ED Triage Notes (Addendum)
Pt comes into the ED via POV c/o body aches and cough.  Pt also states she is having vaginal problems where she feels as though she has something in her vagina and that it looks unusual.  Pt states her cough and body aches for about a week.  Pt admits to h/o drug abuse with last use of cocaine being yesterday.

## 2021-03-29 NOTE — ED Notes (Signed)
Pt to ED for abdominal pain, crying inconsolably, ED provider was hardly able to examine pt, pt states abdominal pain started a week ago and worsened 3 days ago, pt unable to describe pain or if anything makes it better or worse, pt also asked EDP to examine vagina because she thinks "a worm is coming out of vagina".

## 2021-03-30 ENCOUNTER — Ambulatory Visit: Payer: Medicaid Other

## 2021-05-08 ENCOUNTER — Ambulatory Visit
Admission: EM | Admit: 2021-05-08 | Discharge: 2021-05-08 | Disposition: A | Discharge status: Home / Self Care | Payer: Self-pay | Attending: Internal Medicine | Admitting: Internal Medicine

## 2021-05-08 ENCOUNTER — Encounter: Payer: Self-pay | Admitting: Emergency Medicine

## 2021-05-08 ENCOUNTER — Other Ambulatory Visit: Payer: Self-pay

## 2021-05-08 DIAGNOSIS — A5901 Trichomonal vulvovaginitis: Secondary | ICD-10-CM

## 2021-05-08 DIAGNOSIS — L011 Impetiginization of other dermatoses: Secondary | ICD-10-CM

## 2021-05-08 DIAGNOSIS — N760 Acute vaginitis: Secondary | ICD-10-CM

## 2021-05-08 DIAGNOSIS — B9689 Other specified bacterial agents as the cause of diseases classified elsewhere: Secondary | ICD-10-CM | POA: Insufficient documentation

## 2021-05-08 LAB — WET PREP, GENITAL
Sperm: NONE SEEN
WBC, Wet Prep HPF POC: <10 — AB (ref <10)
Yeast Wet Prep HPF POC: NONE SEEN

## 2021-05-08 LAB — URINALYSIS, COMPLETE (UACMP) WITH MICROSCOPIC
Bilirubin Urine: NEGATIVE
Glucose, UA: NEGATIVE mg/dL
Hgb urine dipstick: NEGATIVE
Ketones, ur: NEGATIVE mg/dL
Nitrite: NEGATIVE
Protein, ur: NEGATIVE mg/dL
Specific Gravity, Urine: 1.015 (ref 1.005–1.030)
WBC, UA: >50 WBC/hpf (ref 0–5)
pH: 5.5 (ref 5.0–8.0)

## 2021-05-08 LAB — PREGNANCY, URINE: Preg Test, Ur: NEGATIVE

## 2021-05-08 MED ORDER — METRONIDAZOLE 500 MG PO TABS: 500.0000 mg | ORAL_TABLET | Freq: Two times a day (BID) | ORAL | 0 refills | Status: DC

## 2021-05-08 MED ORDER — CEPHALEXIN 500 MG PO CAPS: 500.0000 mg | ORAL_CAPSULE | Freq: Two times a day (BID) | ORAL | 0 refills | Status: DC

## 2021-05-08 NOTE — ED Provider Notes (Signed)
[facility]    CSN: [id number] Arrival date & time: 05/08/21  1400      History   Chief Complaint Chief Complaint  Patient presents with   Otalgia    bilateral  Vaginal discomfort  HPI Kayla Conner is a 31 y.o. female.  She presents today with 2 chief concerns.  The first is that her ears are hurting very badly, she has chronic hives and has been out of a medication that she takes for hives, she is not sure what the medication is.  For the last 3 weeks she has had a sensation that something was trying to get into her ears, and she has been scratching them.  Now in the last day or so they have been severely painful particularly on the left.  No fever. The second concern is that she has some vaginal irritation, also a sensation that something is trying to get into her vagina, but less irritation and pain.  No discharge.  No bleeding.  Has not missed any periods, is sexually active and not using birth control.  Has some urinary frequency but no urgency or dysuria.  Describes chronic constipation.  Denies abdominal or pelvic pain.  HPI  Past Medical History:  Diagnosis Date   Alcohol abuse    Bipolar 1 disorder (HCC)    Drug use    Hypertension    Miscarriage     Patient Active Problem List   Diagnosis Date Noted   Alcohol abuse 07/07/2015   Cocaine abuse (HCC) 07/07/2015   Substance induced mood disorder (HCC) 07/07/2015    Past Surgical History:  Procedure Laterality Date   CESAREAN SECTION        Home Medications    Prior to Admission medications   Medication Sig Start Date End Date Taking? Authorizing Provider  cephALEXin (KEFLEX) 500 MG capsule Take 1 capsule (500 mg total) by mouth 2 (two) times daily for 7 days. 05/08/21 05/15/21 Yes Isa Rankin, MD  metroNIDAZOLE (FLAGYL) 500 MG tablet Take 1 tablet (500 mg total) by mouth 2 (two) times daily. 05/08/21  Yes Isa Rankin, MD  citalopram (CELEXA) 20 MG tablet Take 20 mg by  mouth daily.    [provider]  erythromycin ophthalmic ointment Place a 1/2 inch ribbon of ointment into the lower eyelid. 10/03/20   Delton See, MD  naproxen (NAPROSYN) 500 MG tablet Take 1 tablet (500 mg total) by mouth 2 (two) times daily with a meal. 10/01/18   Triplett, Cari B, FNP  traMADol (ULTRAM) 50 MG tablet Take 1 tablet (50 mg total) by mouth every 6 (six) hours as needed. 10/01/18   Triplett, Kasandra Knudsen, FNP    Family History No family history on file.  Social History Social History   Tobacco Use   Smoking status: Every Day    Types: Cigarettes   Smokeless tobacco: Never  Vaping Use   Vaping Use: Every day  Substance Use Topics   Alcohol use: Yes    Comment: occ   Drug use: Yes    Types: Marijuana, Cocaine     Allergies   Patient has no known allergies.   Review of Systems Review of Systems see HPI   Physical Exam Triage Vital Signs ED Triage Vitals  Enc Vitals Group     BP 05/08/21 1415 (!) 141/100     Pulse Rate 05/08/21 1415 (!) 110     Resp 05/08/21 1415 18     Temp 05/08/21 1415  98.4 F (36.9 C)     Temp Source 05/08/21 1415 Oral     SpO2 05/08/21 1415 100 %     Weight 05/08/21 1414 119 lb 14.9 oz (54.4 kg)     Height 05/08/21 1414 5\' 8"  (1.727 m)     Pain Score 05/08/21 1413 10     Pain Loc --    Updated Vital Signs BP (!) 141/100 (BP Location: Right Arm)    Pulse (!) 110    Temp 98.4 F (36.9 C) (Oral)    Resp 18    Ht 5\' 8"  (1.727 m)    Wt 54.4 kg    LMP 04/24/2021 (Approximate)    SpO2 100%    BMI 18.24 kg/m   Physical Exam Constitutional:      General: She is in acute distress.     Appearance: She is not ill-appearing or toxic-appearing.     Comments: Holding left ear, looks uncomfortable  HENT:     Head: Atraumatic.  Eyes:     Conjunctiva/sclera:     Right eye: Right conjunctiva is not injected. No exudate.    Left eye: Left conjunctiva is not injected. No exudate.    Comments: Conjugate gaze observed  Cardiovascular:      Rate and Rhythm: Tachycardia present.  Pulmonary:     Effort: Pulmonary effort is normal. No respiratory distress.  Abdominal:     General: There is no distension.  Musculoskeletal:     Cervical back: Neck supple.     Comments: Walked into the urgent care independently, climbed on/off the exam table without assistance  Skin:    General: Skin is warm and dry.     Comments: Numerous narrow, almost slitlike ulcerations or erosions over and behind the ears, and in the ear canals.  Some golden crusting on the floor of the left ear canal.  Unclear if these are spontaneous or related to manipulation due to itching etc.  Warm, dry, no cyanosis  Neurological:     Mental Status: She is alert.     Comments: Face is symmetric, speech is clear, coherent, logical  Declines pelvic exam   UC Treatments / Results  Labs (all labs ordered are listed, but only abnormal results are displayed) Labs Reviewed  WET PREP, GENITAL - Abnormal; Notable for the following components:      Result Value   Trich, Wet Prep PRESENT (*)    Clue Cells Wet Prep HPF POC PRESENT (*)    WBC, Wet Prep HPF POC <10 (*)    All other components within normal limits  URINALYSIS, COMPLETE (UACMP) WITH MICROSCOPIC - Abnormal; Notable for the following components:   APPearance HAZY (*)    Leukocytes,Ua SMALL (*)    Bacteria, UA RARE (*)    Trichomonas, UA PRESENT (*)    All other components within normal limits  PREGNANCY, URINE  CERVICOVAGINAL ANCILLARY ONLY  Pregnancy test negative   EKG  N/A  Radiology No results found. N/A  Procedures Procedures (including critical care time) N/A  Medications Ordered in UC Medications - No data to display N/A  Final Clinical Impressions(s) / UC Diagnoses   Final diagnoses:  Impetiginization of other dermatoses  Trichomonas vaginitis  Bacterial vaginosis     Discharge Instructions      Exam today suggests infection of irritated skin of ears and ear canals.   Prescription for cephalexin (antibiotic) sent to pharmacy.  Try not to scratch or pick scalp or ears, so they can heal.  Clean very gently once daily with mild soap (like dove) and water, and pat dry.  Do not scrub.  Vaginal swabs suggest trichomonas and bacterial vaginosis; prescription for metronidazole was sent to the pharmacy.  Tests for gonorrhea and chlamydia are pending; the urgent care will contact you if additional treatment is needed.  A pregnancy test is also pending.    ED Prescriptions     Medication Sig Dispense Auth. Provider   metroNIDAZOLE (FLAGYL) 500 MG tablet Take 1 tablet (500 mg total) by mouth 2 (two) times daily. 14 tablet Isa RankinMurray, Jahlon Baines Wilson, MD   cephALEXin (KEFLEX) 500 MG capsule Take 1 capsule (500 mg total) by mouth 2 (two) times daily for 7 days. 14 capsule Isa RankinMurray, Aylan Bayona Wilson, MD      PDMP not reviewed this encounter.   Isa RankinMurray, Eleftheria Taborn Wilson, MD 05/10/21 [phone]

## 2021-05-08 NOTE — ED Triage Notes (Signed)
Pt c/o bilateral ear pain. Started about 3 weeks ago. She states she feels like her ear is swollen. She is also c/o vaginal irritation. She denies itching burning or discharge. She states her vagina feels like her ears.

## 2021-05-08 NOTE — Discharge Instructions (Addendum)
Exam today suggests infection of irritated skin of ears and ear canals.  Prescription for cephalexin (antibiotic) sent to pharmacy.  Try not to scratch or pick scalp or ears, so they can heal.  Clean very gently once daily with mild soap (like dove) and water, and pat dry.  Do not scrub.  Vaginal swabs suggest trichomonas and bacterial vaginosis; prescription for metronidazole was sent to the pharmacy.  Tests for gonorrhea and chlamydia are pending; the urgent care will contact you if additional treatment is needed.  A pregnancy test is also pending.

## 2021-05-09 LAB — CERVICOVAGINAL ANCILLARY ONLY
Chlamydia: POSITIVE — AB
Comment: NEGATIVE
Comment: NORMAL
Neisseria Gonorrhea: NEGATIVE

## 2021-05-10 ENCOUNTER — Telehealth (HOSPITAL_COMMUNITY): Payer: Self-pay | Admitting: Emergency Medicine

## 2021-05-10 ENCOUNTER — Telehealth: Payer: Self-pay

## 2021-05-10 MED ORDER — DOXYCYCLINE HYCLATE 100 MG PO CAPS: 100.0000 mg | ORAL_CAPSULE | Freq: Two times a day (BID) | ORAL | 0 refills | Status: AC

## 2021-05-10 MED ORDER — CEPHALEXIN 500 MG PO CAPS: 500.0000 mg | ORAL_CAPSULE | Freq: Two times a day (BID) | ORAL | 0 refills | Status: AC

## 2021-05-10 MED ORDER — METRONIDAZOLE 500 MG PO TABS: 500.0000 mg | ORAL_TABLET | Freq: Two times a day (BID) | ORAL | 0 refills | Status: DC

## 2021-05-10 NOTE — Telephone Encounter (Signed)
Left voice mail to set up new patient appointment 

## 2021-05-10 NOTE — Telephone Encounter (Signed)
-----   Message from Aaron Edelman, RN sent at 05/10/2021 11:23 AM EST ----- Regarding: UC to PCP Patient needs to establish with PCP - routine

## 2021-10-07 ENCOUNTER — Emergency Department
Admission: EM | Admit: 2021-10-07 | Discharge: 2021-10-07 | Discharge status: Against Medical Advice | Payer: Medicaid Other

## 2021-10-07 NOTE — ED Notes (Signed)
Pt called from [facility] to triage without answer.

## 2021-10-07 NOTE — ED Notes (Signed)
PT called to triage without answer.

## 2021-10-07 NOTE — ED Notes (Signed)
Pt called to triage without answer.  

## 2022-06-20 ENCOUNTER — Ambulatory Visit
Admission: EM | Admit: 2022-06-20 | Discharge: 2022-06-20 | Disposition: A | Discharge status: Home / Self Care | Payer: Medicaid Other | Attending: Emergency Medicine | Admitting: Emergency Medicine

## 2022-06-20 ENCOUNTER — Ambulatory Visit (INDEPENDENT_AMBULATORY_CARE_PROVIDER_SITE_OTHER): Payer: Medicaid Other

## 2022-06-20 DIAGNOSIS — B9689 Other specified bacterial agents as the cause of diseases classified elsewhere: Secondary | ICD-10-CM | POA: Insufficient documentation

## 2022-06-20 DIAGNOSIS — N760 Acute vaginitis: Secondary | ICD-10-CM | POA: Insufficient documentation

## 2022-06-20 DIAGNOSIS — B3731 Acute candidiasis of vulva and vagina: Secondary | ICD-10-CM | POA: Diagnosis not present

## 2022-06-20 LAB — URINALYSIS, W/ REFLEX TO CULTURE (INFECTION SUSPECTED)
Bilirubin Urine: NEGATIVE
Glucose, UA: NEGATIVE mg/dL
Hgb urine dipstick: NEGATIVE
Ketones, ur: NEGATIVE mg/dL
Leukocytes,Ua: NEGATIVE
Nitrite: NEGATIVE
Protein, ur: NEGATIVE mg/dL
Specific Gravity, Urine: 1.02 (ref 1.005–1.030)
pH: 7 (ref 5.0–8.0)

## 2022-06-20 LAB — WET PREP, GENITAL
Sperm: NONE SEEN
Trich, Wet Prep: NONE SEEN
WBC, Wet Prep HPF POC: >10 — AB (ref <10)

## 2022-06-20 LAB — PREGNANCY, URINE: Preg Test, Ur: NEGATIVE

## 2022-06-20 MED ORDER — METRONIDAZOLE 500 MG PO TABS: 500.0000 mg | ORAL_TABLET | Freq: Two times a day (BID) | ORAL | 0 refills | Status: DC

## 2022-06-20 MED ORDER — FLUCONAZOLE 150 MG PO TABS: 150.0000 mg | ORAL_TABLET | Freq: Every day | ORAL | 0 refills | Status: AC

## 2022-06-20 NOTE — ED Provider Notes (Signed)
[facility]    CSN: [id number] Arrival date & time: 06/20/22  1035      History   Chief Complaint Chief Complaint  Patient presents with   Foreign Body in Vagina   Abdominal Pain    HPI Kayla Conner is a 32 y.o. female.   HPI  32 year old female here for evaluation of abdominal pain.  The patient reports that she has been experiencing pain in her lower abdomen for the last week and increased last night.  This is associated with nausea and vaginal itching.  She is also had some pain with urination along with urinary urgency and frequency.  She states that when she sits down to use the bathroom she has difficulty standing back up due to the pain in her low abdomen.  She denies any fever or vaginal discharge.  She is unsure about possible STI exposure but she states she is only having unprotected sex with her boyfriend and that they are monogamous.  Past Medical History:  Diagnosis Date   Alcohol abuse    Bipolar 1 disorder ([place])    Drug use    Hypertension    Miscarriage     Patient Active Problem List   Diagnosis Date Noted   Alcohol abuse 07/07/2015   Cocaine abuse ([place]) 07/07/2015   Substance induced mood disorder ([place]) 07/07/2015    Past Surgical History:  Procedure Laterality Date   CESAREAN SECTION      OB History   No obstetric history on file.      Home Medications    Prior to Admission medications   Medication Sig Start Date End Date Taking? Authorizing Provider  fluconazole (DIFLUCAN) 150 MG tablet Take 1 tablet (150 mg total) by mouth daily for 3 doses. Take 1 tablet now and repeat dosing every 3 days for 3 doses. 06/20/22 06/23/22 Yes Margarette Canada, NP  metroNIDAZOLE (FLAGYL) 500 MG tablet Take 1 tablet (500 mg total) by mouth 2 (two) times daily. 06/20/22  Yes Margarette Canada, NP  citalopram (CELEXA) 20 MG tablet Take 20 mg by mouth daily.    [provider]    Family History History reviewed. No pertinent family  history.  Social History Social History   Tobacco Use   Smoking status: Every Day    Types: Cigarettes   Smokeless tobacco: Never  Vaping Use   Vaping Use: Every day  Substance Use Topics   Alcohol use: Yes    Comment: occ   Drug use: Yes    Types: Marijuana, Cocaine     Allergies   Patient has no known allergies.   Review of Systems Review of Systems  Constitutional:  Negative for fever.  Gastrointestinal:  Positive for abdominal pain and nausea. Negative for diarrhea and vomiting.  Genitourinary:  Positive for dysuria, frequency, pelvic pain and urgency. Negative for vaginal bleeding, vaginal discharge and vaginal pain.     Physical Exam Triage Vital Signs ED Triage Vitals  Enc Vitals Group     BP 06/20/22 1053 (!) 146/99     Pulse Rate 06/20/22 1053 82     Resp --      Temp 06/20/22 1053 98.6 F (37 C)     Temp Source 06/20/22 1053 Oral     SpO2 06/20/22 1053 100 %     Weight --      Height 06/20/22 1052 '5\' 8"'$  (1.727 m)     Head Circumference --      Peak Flow --  Pain Score 06/20/22 1052 9     Pain Loc --      Pain Edu? --      Excl. in [place]? --    No data found.  Updated Vital Signs BP (!) 146/99 (BP Location: Right Arm)   Pulse 82   Temp 98.6 F (37 C) (Oral)   Ht '5\' 8"'$  (1.727 m)   LMP 06/13/2022 (Approximate)   SpO2 100%   BMI 18.24 kg/m   Visual Acuity Right Eye Distance:   Left Eye Distance:   Bilateral Distance:    Right Eye Near:   Left Eye Near:    Bilateral Near:     Physical Exam Vitals and nursing note reviewed. Exam conducted with a chaperone present Janeal Holmes, RN).  Constitutional:      Appearance: Normal appearance. She is not ill-appearing.  HENT:     Head: Normocephalic and atraumatic.  Cardiovascular:     Rate and Rhythm: Normal rate and regular rhythm.     Pulses: Normal pulses.     Heart sounds: Normal heart sounds. No murmur heard.    No friction rub. No gallop.  Pulmonary:     Effort: Pulmonary effort is  normal.     Breath sounds: Normal breath sounds. No wheezing, rhonchi or rales.  Abdominal:     General: Abdomen is flat. There is distension.     Palpations: Abdomen is soft.     Tenderness: There is abdominal tenderness. There is no right CVA tenderness, left CVA tenderness, guarding or rebound.  Genitourinary:    General: Normal vulva.     Vagina: No vaginal discharge.     Comments: Vulva is normal in appearance and the labia majora are free of erythema, edema, and tenderness.  Vestibule is pink in color and free of edema or tenderness.  Cervical os is closed and cervix is pink in color.  No discharge in the vaginal vault.  No cervical motion tenderness. Skin:    General: Skin is warm.     Capillary Refill: Capillary refill takes less than 2 seconds.  Neurological:     Mental Status: She is alert.      UC Treatments / Results  Labs (all labs ordered are listed, but only abnormal results are displayed) Labs Reviewed  WET PREP, GENITAL - Abnormal; Notable for the following components:      Result Value   Yeast Wet Prep HPF POC PRESENT (*)    Clue Cells Wet Prep HPF POC PRESENT (*)    WBC, Wet Prep HPF POC >10 (*)    All other components within normal limits  URINALYSIS, W/ REFLEX TO CULTURE (INFECTION SUSPECTED) - Abnormal; Notable for the following components:   Bacteria, UA FEW (*)    All other components within normal limits  URINE CULTURE  PREGNANCY, URINE  CERVICOVAGINAL ANCILLARY ONLY    EKG   Radiology DG Abd 2 Views  Result Date: 06/20/2022 CLINICAL DATA:  Lower abdominal pain.  Concern for retained tampon. EXAM: ABDOMEN - 2 VIEW COMPARISON:  03/29/2021 FINDINGS: Moderate stool. There is no edema, consolidation, effusion, or pneumothorax. No concerning mass effect or calcification, pelvic calcifications best attributed to phlebolith. Clear lung bases. IMPRESSION: No acute finding. A tampon is would not be reliably visualized by radiography. Electronically Signed    By: Jorje Guild M.D.   On: 06/20/2022 12:11    Procedures Procedures (including critical care time)  Medications Ordered in UC Medications - No data to display  Initial Impression /  Assessment and Plan / UC Course  I have reviewed the triage vital signs and the nursing notes.  Pertinent labs & imaging results that were available during my care of the patient were reviewed by me and considered in my medical decision making (see chart for details).   Patient is a pleasant 32 year old female who appears to be in a moderate degree of pain and is unable to stand up fully due to the pain in her abdomen presenting for greater than 1 week of lower abdominal pain that increased last night.  She is unsure if she has a retained tampon from a week ago in place.  She has marked tenderness to her lower abdomen with mild distention but no guarding or rebound.  Vaginal speculum exam was performed with Tiara the RN as chaperone and did not demonstrate any retained tampon or discharge in the vaginal vault.  The cervix is normal in appearance with a closed os and its free of cervical motion tenderness.  I will order a vaginal wet prep, cytology swab for gonorrhea and chlamydia, urinalysis, and urine pregnancy test.  I will also order a 2 view abdomen series due to the distention to look for any intra-abdominal pathology that could explain the patient's pain.  Urinalysis shows few bacteria with 11-20 WBCs but is negative for leukocyte esterase or nitrites.  She also is WBC clumps.  Urine will reflex to culture.    Urine pregnancy test is negative.  Vaginal wet prep is positive for yeast and clue cells.  Radiology impression of 2 view abdomen states that there is a moderate stool burden but no acute finding.  I will discharge patient home with a diagnosis of vaginal yeast infection and bacterial vaginosis and treat her with metronidazole 500 mg twice daily for 7 days along with Diflucan 150 mg 1 tablet now and  repeat every 3 days for treatment of the vaginal yeast infection.  Her gonorrhea and Chlamydia testing will be back tomorrow and she will be contacted if any of her results are positive.  She can use over-the-counter Tylenol and NSAIDs as needed for pain relief.     Final Clinical Impressions(s) / UC Diagnoses   Final diagnoses:  BV (bacterial vaginosis)  Vaginal yeast infection     Discharge Instructions      Your vaginal swab was positive for both bacterial vaginosis and a vaginal yeast infection which can be causing your discomfort.  Your urinalysis showed some bacteria under the microscope but there was no definitive UTI on your dip.  I am sending your urine for culture.  Take the Flagyl (metronidazole) 500 mg twice daily for treatment of your bacterial vaginosis.  Avoid alcohol while on the metronidazole as taken together will cause of vomiting.  Bacterial vaginosis is often caused by a imbalance of bacteria in your vaginal vault.  This is sometimes a result of using tampons or hormonal fluctuations during her menstrual cycle.  You if your symptoms are recurrent you can try using a boric acid suppository twice weekly to help maintain the acid-base balance in your vagina vault which could prevent further infection.  You can also try vaginal probiotics to help return normal bacterial balance.   For your vaginal yeast infection take 1 Diflucan tablet now and repeat dosing every 3 days x 3 doses.  If your test for gonorrhea and chlamydia come back positive you will be contacted by phone.  If both results are negative you will get a notification that you  have new labs in your MyChart.     ED Prescriptions     Medication Sig Dispense Auth. Provider   metroNIDAZOLE (FLAGYL) 500 MG tablet Take 1 tablet (500 mg total) by mouth 2 (two) times daily. 14 tablet Margarette Canada, NP   fluconazole (DIFLUCAN) 150 MG tablet Take 1 tablet (150 mg total) by mouth daily for 3 doses. Take 1 tablet  now and repeat dosing every 3 days for 3 doses. 3 tablet Margarette Canada, NP      PDMP not reviewed this encounter.   Margarette Canada, NP 06/20/22 1227

## 2022-06-20 NOTE — Discharge Instructions (Signed)
Your vaginal swab was positive for both bacterial vaginosis and a vaginal yeast infection which can be causing your discomfort.  Your urinalysis showed some bacteria under the microscope but there was no definitive UTI on your dip.  I am sending your urine for culture.  Take the Flagyl (metronidazole) 500 mg twice daily for treatment of your bacterial vaginosis.  Avoid alcohol while on the metronidazole as taken together will cause of vomiting.  Bacterial vaginosis is often caused by a imbalance of bacteria in your vaginal vault.  This is sometimes a result of using tampons or hormonal fluctuations during her menstrual cycle.  You if your symptoms are recurrent you can try using a boric acid suppository twice weekly to help maintain the acid-base balance in your vagina vault which could prevent further infection.  You can also try vaginal probiotics to help return normal bacterial balance.   For your vaginal yeast infection take 1 Diflucan tablet now and repeat dosing every 3 days x 3 doses.  If your test for gonorrhea and chlamydia come back positive you will be contacted by phone.  If both results are negative you will get a notification that you have new labs in your MyChart.

## 2022-06-20 NOTE — ED Triage Notes (Signed)
Pt c/o lower abdominal pain, pt states she's concerned for possible retained tampon x couple days. Pt denies any fevers

## 2022-06-21 LAB — CERVICOVAGINAL ANCILLARY ONLY
Chlamydia: NEGATIVE
Comment: NEGATIVE
Comment: NORMAL
Neisseria Gonorrhea: NEGATIVE

## 2022-06-22 LAB — URINE CULTURE: Culture: 60000 — AB

## 2022-06-24 ENCOUNTER — Telehealth (HOSPITAL_COMMUNITY): Payer: Self-pay | Admitting: Emergency Medicine

## 2022-06-24 MED ORDER — NITROFURANTOIN MONOHYD MACRO 100 MG PO CAPS: 100.0000 mg | ORAL_CAPSULE | Freq: Two times a day (BID) | ORAL | 0 refills | Status: DC

## 2022-08-16 ENCOUNTER — Other Ambulatory Visit: Payer: Self-pay | Admitting: Family Medicine

## 2022-08-16 DIAGNOSIS — M542 Cervicalgia: Secondary | ICD-10-CM

## 2022-08-21 ENCOUNTER — Other Ambulatory Visit: Payer: Self-pay | Admitting: Family Medicine

## 2022-08-21 DIAGNOSIS — N63 Unspecified lump in unspecified breast: Secondary | ICD-10-CM

## 2022-09-04 ENCOUNTER — Inpatient Hospital Stay: Admission: RE | Admit: 2022-09-04 | Payer: Medicaid Other | Source: Ambulatory Visit

## 2022-09-04 ENCOUNTER — Other Ambulatory Visit: Payer: Medicaid Other

## 2022-11-05 ENCOUNTER — Other Ambulatory Visit: Payer: Medicaid Other

## 2022-11-05 ENCOUNTER — Inpatient Hospital Stay: Admission: RE | Admit: 2022-11-05 | Payer: Medicaid Other | Source: Ambulatory Visit

## 2023-03-10 ENCOUNTER — Encounter: Payer: Self-pay | Admitting: Family Medicine

## 2023-03-11 ENCOUNTER — Ambulatory Visit
Admission: RE | Admit: 2023-03-11 | Discharge: 2023-03-11 | Disposition: A | Discharge status: Home / Self Care | Payer: MEDICAID | Attending: Family Medicine | Admitting: Family Medicine

## 2023-03-11 ENCOUNTER — Ambulatory Visit
Admission: RE | Admit: 2023-03-11 | Discharge: 2023-03-11 | Disposition: A | Discharge status: Home / Self Care | Payer: MEDICAID | Source: Ambulatory Visit | Attending: Family Medicine | Admitting: Family Medicine

## 2023-03-11 ENCOUNTER — Other Ambulatory Visit: Payer: Self-pay | Admitting: Family Medicine

## 2023-03-11 DIAGNOSIS — M2540 Effusion, unspecified joint: Secondary | ICD-10-CM

## 2023-03-11 DIAGNOSIS — M542 Cervicalgia: Secondary | ICD-10-CM | POA: Insufficient documentation

## 2023-03-14 ENCOUNTER — Encounter: Payer: Self-pay | Admitting: Family Medicine

## 2023-03-14 ENCOUNTER — Other Ambulatory Visit: Payer: Self-pay | Admitting: Family Medicine

## 2023-03-14 DIAGNOSIS — N63 Unspecified lump in unspecified breast: Secondary | ICD-10-CM

## 2023-05-20 ENCOUNTER — Inpatient Hospital Stay (HOSPITAL_BASED_OUTPATIENT_CLINIC_OR_DEPARTMENT_OTHER): Admission: RE | Admit: 2023-05-20 | Payer: MEDICAID | Source: Ambulatory Visit | Admitting: Radiology

## 2023-06-02 ENCOUNTER — Ambulatory Visit
Admission: RE | Admit: 2023-06-02 | Discharge: 2023-06-02 | Disposition: A | Discharge status: Home / Self Care | Payer: MEDICAID | Source: Ambulatory Visit | Attending: Family Medicine | Admitting: Family Medicine

## 2023-06-02 DIAGNOSIS — N63 Unspecified lump in unspecified breast: Secondary | ICD-10-CM | POA: Insufficient documentation

## 2023-06-30 ENCOUNTER — Other Ambulatory Visit: Payer: Self-pay | Admitting: Rheumatology

## 2023-06-30 DIAGNOSIS — S83206A Unspecified tear of unspecified meniscus, current injury, right knee, initial encounter: Secondary | ICD-10-CM

## 2023-07-03 ENCOUNTER — Encounter: Payer: Self-pay | Admitting: Rheumatology

## 2023-08-08 ENCOUNTER — Ambulatory Visit
Admission: EM | Admit: 2023-08-08 | Discharge: 2023-08-08 | Disposition: A | Discharge status: Home / Self Care | Payer: MEDICAID | Attending: Emergency Medicine | Admitting: Emergency Medicine

## 2023-08-08 ENCOUNTER — Encounter: Payer: Self-pay | Admitting: Emergency Medicine

## 2023-08-08 DIAGNOSIS — N179 Acute kidney failure, unspecified: Secondary | ICD-10-CM

## 2023-08-08 DIAGNOSIS — A419 Sepsis, unspecified organism: Secondary | ICD-10-CM | POA: Diagnosis present

## 2023-08-08 DIAGNOSIS — E876 Hypokalemia: Secondary | ICD-10-CM

## 2023-08-08 DIAGNOSIS — A5901 Trichomonal vulvovaginitis: Secondary | ICD-10-CM | POA: Diagnosis present

## 2023-08-08 DIAGNOSIS — N3001 Acute cystitis with hematuria: Secondary | ICD-10-CM

## 2023-08-08 DIAGNOSIS — E871 Hypo-osmolality and hyponatremia: Secondary | ICD-10-CM | POA: Diagnosis present

## 2023-08-08 LAB — CBC WITH DIFFERENTIAL/PLATELET
Abs Immature Granulocytes: 0.08 10*3/uL — ABNORMAL HIGH (ref 0.00–0.07)
Basophils Absolute: 0 10*3/uL (ref 0.0–0.1)
Basophils Relative: 0 %
Eosinophils Absolute: 0 10*3/uL (ref 0.0–0.5)
Eosinophils Relative: 0 %
HCT: 36.8 % (ref 36.0–46.0)
Hemoglobin: 12.5 g/dL (ref 12.0–15.0)
Immature Granulocytes: 1 %
Lymphocytes Relative: 5 %
Lymphs Abs: 0.9 10*3/uL (ref 0.7–4.0)
MCH: 26.8 pg (ref 26.0–34.0)
MCHC: 34 g/dL (ref 30.0–36.0)
MCV: 79 fL — ABNORMAL LOW (ref 80.0–100.0)
Monocytes Absolute: 2.1 10*3/uL — ABNORMAL HIGH (ref 0.1–1.0)
Monocytes Relative: 13 %
Neutro Abs: 13.7 10*3/uL — ABNORMAL HIGH (ref 1.7–7.7)
Neutrophils Relative %: 81 %
Platelets: 232 10*3/uL (ref 150–400)
RBC: 4.66 MIL/uL (ref 3.87–5.11)
RDW: 13.2 % (ref 11.5–15.5)
WBC: 16.8 10*3/uL — ABNORMAL HIGH (ref 4.0–10.5)
nRBC: 0 % (ref 0.0–0.2)

## 2023-08-08 LAB — URINALYSIS, W/ REFLEX TO CULTURE (INFECTION SUSPECTED)
Bilirubin Urine: NEGATIVE
Glucose, UA: NEGATIVE mg/dL
Ketones, ur: 40 mg/dL — AB
Nitrite: POSITIVE — AB
Protein, ur: 100 mg/dL — AB
Specific Gravity, Urine: 1.015 (ref 1.005–1.030)
WBC, UA: >50 WBC/hpf (ref 0–5)
pH: 7.5 (ref 5.0–8.0)

## 2023-08-08 LAB — RESP PANEL BY RT-PCR (FLU A&B, COVID) ARPGX2
Influenza A by PCR: NEGATIVE
Influenza B by PCR: NEGATIVE
SARS Coronavirus 2 by RT PCR: NEGATIVE

## 2023-08-08 LAB — COMPREHENSIVE METABOLIC PANEL WITH GFR
ALT: 12 U/L (ref 0–44)
AST: 16 U/L (ref 15–41)
Albumin: 3.9 g/dL (ref 3.5–5.0)
Alkaline Phosphatase: 59 U/L (ref 38–126)
Anion gap: 10 (ref 5–15)
BUN: 9 mg/dL (ref 6–20)
CO2: 26 mmol/L (ref 22–32)
Calcium: 8.5 mg/dL — ABNORMAL LOW (ref 8.9–10.3)
Chloride: 93 mmol/L — ABNORMAL LOW (ref 98–111)
Creatinine, Ser: 1.01 mg/dL — ABNORMAL HIGH (ref 0.44–1.00)
GFR, Estimated: >60 mL/min (ref >60)
Glucose, Bld: 112 mg/dL — ABNORMAL HIGH (ref 70–99)
Potassium: 3 mmol/L — ABNORMAL LOW (ref 3.5–5.1)
Sodium: 129 mmol/L — ABNORMAL LOW (ref 135–145)
Total Bilirubin: 0.7 mg/dL (ref 0.0–1.2)
Total Protein: 7.4 g/dL (ref 6.5–8.1)

## 2023-08-08 LAB — GROUP A STREP BY PCR: Group A Strep by PCR: NOT DETECTED

## 2023-08-08 MED ORDER — ONDANSETRON 8 MG PO TBDP
8.0000 mg | ORAL_TABLET | Freq: Once | ORAL | Status: AC
  Administered 2023-08-08: 8 mg via ORAL

## 2023-08-08 MED ORDER — ACETAMINOPHEN 325 MG PO TABS
975.0000 mg | ORAL_TABLET | Freq: Once | ORAL | Status: AC
  Administered 2023-08-08: 975 mg via ORAL

## 2023-08-08 NOTE — Discharge Instructions (Addendum)
 Please go to [facility] for evaluation as there is concern that you are developing sepsis.

## 2023-08-08 NOTE — ED Notes (Signed)
 Patient is being discharged from the Urgent Care and sent to the Emergency Department via POV . Per Kent Pear, NP, patient is in need of higher level of care due to possible sepsis, hypokalemia, hyponatremia, and acute kidney injury. Patient is aware and verbalizes understanding of plan of care.  Vitals:   08/08/23 1330  BP: 137/87  Pulse: (!) 120  Resp: 14  Temp: (!) 101.4 F (38.6 C)  SpO2: 96%

## 2023-08-08 NOTE — ED Triage Notes (Signed)
 Patient c/o vomiting and bodyaches that started 4 days ago.  Patient denies any other cold symptoms.  Patient unsure of fevers.

## 2023-08-08 NOTE — ED Provider Notes (Signed)
 [facility]    CSN: [id number] Arrival date & time: 08/08/23  1319      History   Chief Complaint Chief Complaint  Patient presents with   Emesis   Generalized Body Aches    HPI Bibi Economos is a 33 y.o. female.   HPI  33 year old female with past medical history significant for hypertension, bipolar 1 disorder, alcohol and drug use presents for evaluation of nausea, vomiting, and bodyaches that been going on for the last 4 days.  She reports that she just started to drink fluids today.  She is also experiencing runny nose, nasal congestion, and chills.  She has not measured a fever at home and she denies sore throat, ear pain, or cough.  She does endorse generalized abdominal pain and diarrhea.  No known sick contacts or recent travel.  Past Medical History:  Diagnosis Date   Alcohol abuse    Bipolar 1 disorder (HCC)    Drug use    Hypertension    Miscarriage     Patient Active Problem List   Diagnosis Date Noted   Alcohol abuse 07/07/2015   Cocaine abuse (HCC) 07/07/2015   Substance induced mood disorder (HCC) 07/07/2015    Past Surgical History:  Procedure Laterality Date   CESAREAN SECTION      OB History   No obstetric history on file.      Home Medications    Prior to Admission medications   Medication Sig Start Date End Date Taking? Authorizing Provider  citalopram (CELEXA) 20 MG tablet Take 20 mg by mouth daily.    [provider]  metroNIDAZOLE  (FLAGYL ) 500 MG tablet Take 1 tablet (500 mg total) by mouth 2 (two) times daily. 06/20/22   Kent Pear, NP  nitrofurantoin , macrocrystal-monohydrate, (MACROBID ) 100 MG capsule Take 1 capsule (100 mg total) by mouth 2 (two) times daily. 06/24/22   Lamptey, Donley Furth, MD    Family History Family History  Problem Relation Age of Onset   Breast cancer Paternal Aunt     Social History Social History   Tobacco Use   Smoking status: Every Day    Types: Cigarettes    Smokeless tobacco: Never  Vaping Use   Vaping status: Every Day  Substance Use Topics   Alcohol use: Yes    Comment: occ   Drug use: Yes    Types: Marijuana, Cocaine     Allergies   Patient has no known allergies.   Review of Systems Review of Systems  Constitutional:  Negative for fever.  HENT:  Positive for congestion and rhinorrhea. Negative for ear pain and sore throat.   Respiratory:  Negative for cough.   Gastrointestinal:  Positive for abdominal pain, nausea and vomiting. Negative for diarrhea.  Musculoskeletal:  Positive for arthralgias and myalgias.  Skin:  Negative for rash.  Neurological:  Positive for headaches.  Hematological: Negative.   Psychiatric/Behavioral: Negative.       Physical Exam Triage Vital Signs ED Triage Vitals [08/08/23 1327]  Encounter Vitals Group     BP      Systolic BP Percentile      Diastolic BP Percentile      Pulse      Resp      Temp      Temp src      SpO2      Weight 119 lb 14.9 oz (54.4 kg)     Height 5\' 8"  (1.727 m)     Head Circumference  Peak Flow      Pain Score 10     Pain Loc      Pain Education      Exclude from Growth Chart    No data found.  Updated Vital Signs BP 137/87 (BP Location: Right Arm)   Pulse (!) 120   Temp (!) 101.4 F (38.6 C) (Oral)   Resp 14   Ht 5\' 8"  (1.727 m)   Wt 119 lb 14.9 oz (54.4 kg)   LMP 07/25/2023 (Approximate)   SpO2 96%   BMI 18.24 kg/m   Visual Acuity Right Eye Distance:   Left Eye Distance:   Bilateral Distance:    Right Eye Near:   Left Eye Near:    Bilateral Near:     Physical Exam Vitals and nursing note reviewed.  Constitutional:      Appearance: Normal appearance. She is ill-appearing.  HENT:     Head: Normocephalic and atraumatic.     Right Ear: Tympanic membrane, ear canal and external ear normal. There is no impacted cerumen.     Left Ear: Tympanic membrane, ear canal and external ear normal. There is no impacted cerumen.     Nose: Congestion  present. No rhinorrhea.     Comments: Nasal mucosa is erythematous and edematous without appreciable discharge.    Mouth/Throat:     Mouth: Mucous membranes are moist.     Pharynx: Oropharynx is clear. No oropharyngeal exudate or posterior oropharyngeal erythema.  Cardiovascular:     Rate and Rhythm: Normal rate and regular rhythm.     Pulses: Normal pulses.     Heart sounds: Normal heart sounds. No murmur heard.    No friction rub. No gallop.  Pulmonary:     Effort: Pulmonary effort is normal.     Breath sounds: Normal breath sounds. No wheezing, rhonchi or rales.  Abdominal:     General: Abdomen is flat.     Palpations: Abdomen is soft.     Tenderness: There is abdominal tenderness. There is no guarding or rebound.  Musculoskeletal:     Cervical back: Normal range of motion and neck supple. No tenderness.  Lymphadenopathy:     Cervical: No cervical adenopathy.  Skin:    General: Skin is warm and dry.     Capillary Refill: Capillary refill takes less than 2 seconds.     Findings: No rash.  Neurological:     General: No focal deficit present.     Mental Status: She is alert and oriented to person, place, and time.      UC Treatments / Results  Labs (all labs ordered are listed, but only abnormal results are displayed) Labs Reviewed  CBC WITH DIFFERENTIAL/PLATELET - Abnormal; Notable for the following components:      Result Value   WBC 16.8 (*)    MCV 79.0 (*)    Neutro Abs 13.7 (*)    Monocytes Absolute 2.1 (*)    Abs Immature Granulocytes 0.08 (*)    All other components within normal limits  COMPREHENSIVE METABOLIC PANEL WITH GFR - Abnormal; Notable for the following components:   Sodium 129 (*)    Potassium 3.0 (*)    Chloride 93 (*)    Glucose, Bld 112 (*)    Creatinine, Ser 1.01 (*)    Calcium 8.5 (*)    All other components within normal limits  URINALYSIS, W/ REFLEX TO CULTURE (INFECTION SUSPECTED) - Abnormal; Notable for the following components:    APPearance HAZY (*)  Hgb urine dipstick TRACE (*)    Ketones, ur 40 (*)    Protein, ur 100 (*)    Nitrite POSITIVE (*)    Leukocytes,Ua SMALL (*)    Bacteria, UA MANY (*)    Trichomonas, UA PRESENT (*)    All other components within normal limits  RESP PANEL BY RT-PCR (FLU A&B, COVID) [id number]  GROUP A STREP BY PCR  URINE CULTURE    EKG   Radiology No results found.  Procedures Procedures (including critical care time)  Medications Ordered in UC Medications  ondansetron  (ZOFRAN -ODT) disintegrating tablet 8 mg (8 mg Oral Given 08/08/23 1334)  acetaminophen  (TYLENOL ) tablet 975 mg (975 mg Oral Given 08/08/23 1357)    Initial Impression / Assessment and Plan / UC Course  I have reviewed the triage vital signs and the nursing notes.  Pertinent labs & imaging results that were available during my care of the patient were reviewed by me and considered in my medical decision making (see chart for details).   Patient is a pleasant, though ill-appearing, 33 year old female presenting for evaluation of nausea, vomiting, and bodyaches that started 4 days ago.  She reports that she has had decreased p.o. intake but has started taking fluids today.  She has not had any diarrhea.  The patient is also endorsing mild runny nose nasal congestion, chills, and headache.  Her physical exam does reveal inflamed nasal mucosa without appreciable rhinorrhea.  Oropharyngeal exam is benign.  Cardiopulmonary exam Nischal lung sounds in all fields.  Abdomen is soft, flat, with generalized abdominal tenderness more prominent in the lower abdomen.  No tenderness with palpation over McBurney's point.  She is febrile in clinic with an oral temp of 101.4.  She is also extremely nauseous so I will order 4 mg of ODT Zofran  and have that followed up in 20 minutes x 975 mg of Tylenol .  Given her decreased p.o. intake there is concern for dehydration so I will check a urinalysis.  I will also check a CBC and CMP to  evaluate for presence of systemic infection or electrolyte abnormality.  Additionally, I will order a strep PCR and a COVID and flu PCR.  CBC shows a white count of 16.8.  H&H normal at 12.5 and 36.8 respectively.  Platelets normal at 232.  Neutrophil number is elevated at 13.7 and monocyte number is elevated at 2.1.  Elevation to absolute immature granulocytes of 0.08.  Strep PCR is negative.  Urinalysis shows hazy appearance with trace hemoglobin, 40 ketones, 100 protein, nitrite positive with small leukocyte esterase.  Reflex microscopy shows epithelials but also greater than 50 WBCs, many bacteria, WBC clumps, and trichomonas.  Due to the skin contamination I will order a recollection of the urine culture.  Respiratory panel is negative for COVID or influenza.  CMP shows hyponatremia with a sodium of 129, hypokalemia with potassium of 3, low chloride at 93, AKI with creatinine 1.01, transaminases are unremarkable.  Given patient's abnormal blood work and presentation I am concerned that she is developing sepsis and have discussed this with her.  She has elected to go to [facility].  She is going to call around and see if she can find a ride.   Final Clinical Impressions(s) / UC Diagnoses   Final diagnoses:  Acute cystitis with hematuria  Acute kidney injury (HCC)  Hyponatremia  Hypokalemia  Sepsis, due to unspecified organism, unspecified whether acute organ dysfunction present (HCC)  Trichomonas vaginalis (TV) infection  Discharge Instructions      Please go to [facility] for evaluation as there is concern that you are developing sepsis.     ED Prescriptions   None    PDMP not reviewed this encounter.   Kent Pear, NP 08/08/23 1422

## 2023-08-10 LAB — URINE CULTURE
Culture: >=100000 — AB
Special Requests: NORMAL
